# Patient Record
Sex: Female | Born: 1975 | Hispanic: No | Marital: Single | Smoking: Current every day smoker
Health system: Southern US, Community
[De-identification: ages and names within clinical notes are randomized; demographics above are authoritative.]

## PROBLEM LIST (undated history)

## (undated) DIAGNOSIS — F419 Anxiety disorder, unspecified: Secondary | ICD-10-CM

## (undated) DIAGNOSIS — K7469 Other cirrhosis of liver: Secondary | ICD-10-CM

## (undated) DIAGNOSIS — K760 Fatty (change of) liver, not elsewhere classified: Secondary | ICD-10-CM

## (undated) DIAGNOSIS — L409 Psoriasis, unspecified: Secondary | ICD-10-CM

## (undated) DIAGNOSIS — K7460 Unspecified cirrhosis of liver: Secondary | ICD-10-CM

## (undated) DIAGNOSIS — K3189 Other diseases of stomach and duodenum: Secondary | ICD-10-CM

## (undated) DIAGNOSIS — E119 Type 2 diabetes mellitus without complications: Secondary | ICD-10-CM

## (undated) DIAGNOSIS — R7989 Other specified abnormal findings of blood chemistry: Secondary | ICD-10-CM

## (undated) DIAGNOSIS — F411 Generalized anxiety disorder: Secondary | ICD-10-CM

## (undated) DIAGNOSIS — R945 Abnormal results of liver function studies: Secondary | ICD-10-CM

## (undated) DIAGNOSIS — E559 Vitamin D deficiency, unspecified: Secondary | ICD-10-CM

## (undated) DIAGNOSIS — K219 Gastro-esophageal reflux disease without esophagitis: Secondary | ICD-10-CM

## (undated) DIAGNOSIS — F329 Major depressive disorder, single episode, unspecified: Secondary | ICD-10-CM

## (undated) DIAGNOSIS — F32A Depression, unspecified: Secondary | ICD-10-CM

## (undated) HISTORY — DX: Unspecified cirrhosis of liver: K74.60

## (undated) HISTORY — DX: Fatty (change of) liver, not elsewhere classified: K76.0

## (undated) HISTORY — PX: LIVER BIOPSY: SHX301

## (undated) HISTORY — DX: Generalized anxiety disorder: F41.1

## (undated) HISTORY — DX: Vitamin D deficiency, unspecified: E55.9

## (undated) HISTORY — DX: Psoriasis, unspecified: L40.9

## (undated) HISTORY — DX: Gastro-esophageal reflux disease without esophagitis: K21.9

## (undated) HISTORY — DX: Type 2 diabetes mellitus without complications: E11.9

## (undated) HISTORY — DX: Depression, unspecified: F32.A

## (undated) HISTORY — DX: Anxiety disorder, unspecified: F41.9

## (undated) HISTORY — PX: UPPER GASTROINTESTINAL ENDOSCOPY: SHX188

## (undated) HISTORY — DX: Other diseases of stomach and duodenum: K31.89

## (undated) HISTORY — DX: Major depressive disorder, single episode, unspecified: F32.9

---

## 1997-03-20 HISTORY — PX: WISDOM TOOTH EXTRACTION: SHX21

## 2003-05-05 ENCOUNTER — Other Ambulatory Visit: Admission: RE | Admit: 2003-05-05 | Discharge: 2003-05-05 | Payer: Self-pay | Admitting: Obstetrics and Gynecology

## 2004-11-24 ENCOUNTER — Other Ambulatory Visit: Admission: RE | Admit: 2004-11-24 | Discharge: 2004-11-24 | Payer: Self-pay | Admitting: Obstetrics and Gynecology

## 2008-12-07 ENCOUNTER — Other Ambulatory Visit: Admission: RE | Admit: 2008-12-07 | Discharge: 2008-12-07 | Payer: Self-pay | Admitting: Family Medicine

## 2012-08-22 ENCOUNTER — Other Ambulatory Visit (HOSPITAL_COMMUNITY)
Admission: RE | Admit: 2012-08-22 | Discharge: 2012-08-22 | Disposition: A | Discharge status: Home / Self Care | Payer: BC Managed Care – PPO | Source: Ambulatory Visit | Attending: Obstetrics and Gynecology | Admitting: Obstetrics and Gynecology

## 2012-08-22 ENCOUNTER — Other Ambulatory Visit: Payer: Self-pay | Admitting: Nurse Practitioner

## 2012-08-22 DIAGNOSIS — Z1151 Encounter for screening for human papillomavirus (HPV): Secondary | ICD-10-CM | POA: Insufficient documentation

## 2012-08-22 DIAGNOSIS — Z01419 Encounter for gynecological examination (general) (routine) without abnormal findings: Secondary | ICD-10-CM | POA: Insufficient documentation

## 2013-08-12 ENCOUNTER — Other Ambulatory Visit: Payer: Self-pay | Admitting: Gastroenterology

## 2013-08-12 DIAGNOSIS — R1013 Epigastric pain: Secondary | ICD-10-CM

## 2013-08-18 ENCOUNTER — Ambulatory Visit
Admission: RE | Admit: 2013-08-18 | Discharge: 2013-08-18 | Disposition: A | Discharge status: Home / Self Care | Payer: BC Managed Care – PPO | Source: Ambulatory Visit | Attending: Gastroenterology | Admitting: Gastroenterology

## 2013-08-18 DIAGNOSIS — R1013 Epigastric pain: Secondary | ICD-10-CM

## 2013-08-20 ENCOUNTER — Other Ambulatory Visit: Payer: Self-pay | Admitting: Gastroenterology

## 2013-10-15 ENCOUNTER — Other Ambulatory Visit: Payer: Self-pay | Admitting: Gastroenterology

## 2013-10-15 DIAGNOSIS — R945 Abnormal results of liver function studies: Principal | ICD-10-CM

## 2013-10-15 DIAGNOSIS — R7989 Other specified abnormal findings of blood chemistry: Secondary | ICD-10-CM

## 2013-10-23 ENCOUNTER — Ambulatory Visit
Admission: RE | Admit: 2013-10-23 | Discharge: 2013-10-23 | Disposition: A | Discharge status: Home / Self Care | Payer: BC Managed Care – PPO | Source: Ambulatory Visit | Attending: Gastroenterology | Admitting: Gastroenterology

## 2013-10-23 DIAGNOSIS — R945 Abnormal results of liver function studies: Principal | ICD-10-CM

## 2013-10-23 DIAGNOSIS — R7989 Other specified abnormal findings of blood chemistry: Secondary | ICD-10-CM

## 2013-10-23 MED ORDER — IOHEXOL 300 MG/ML  SOLN
125.0000 mL | Freq: Once | INTRAMUSCULAR | Status: AC | PRN
  Administered 2013-10-23: 125 mL via INTRAVENOUS

## 2014-03-10 ENCOUNTER — Other Ambulatory Visit (HOSPITAL_COMMUNITY): Payer: Self-pay | Admitting: Gastroenterology

## 2014-03-10 DIAGNOSIS — R945 Abnormal results of liver function studies: Principal | ICD-10-CM

## 2014-03-10 DIAGNOSIS — R7989 Other specified abnormal findings of blood chemistry: Secondary | ICD-10-CM

## 2014-03-12 ENCOUNTER — Other Ambulatory Visit: Payer: Self-pay | Admitting: Radiology

## 2014-03-18 ENCOUNTER — Other Ambulatory Visit: Payer: Self-pay | Admitting: Radiology

## 2014-03-19 ENCOUNTER — Encounter (HOSPITAL_COMMUNITY): Payer: Self-pay

## 2014-03-19 ENCOUNTER — Ambulatory Visit (HOSPITAL_COMMUNITY)
Admission: RE | Admit: 2014-03-19 | Discharge: 2014-03-19 | Disposition: A | Discharge status: Home / Self Care | Payer: BC Managed Care – PPO | Source: Ambulatory Visit | Attending: Gastroenterology | Admitting: Gastroenterology

## 2014-03-19 DIAGNOSIS — R945 Abnormal results of liver function studies: Secondary | ICD-10-CM

## 2014-03-19 DIAGNOSIS — R740 Nonspecific elevation of levels of transaminase and lactic acid dehydrogenase [LDH]: Secondary | ICD-10-CM | POA: Insufficient documentation

## 2014-03-19 DIAGNOSIS — R7989 Other specified abnormal findings of blood chemistry: Secondary | ICD-10-CM

## 2014-03-19 LAB — CBC
HCT: 47.2 % — ABNORMAL HIGH (ref 36.0–46.0)
Hemoglobin: 16.2 g/dL — ABNORMAL HIGH (ref 12.0–15.0)
MCH: 30.3 pg (ref 26.0–34.0)
MCHC: 34.3 g/dL (ref 30.0–36.0)
MCV: 88.4 fL (ref 78.0–100.0)
Platelets: 180 10*3/uL (ref 150–400)
RBC: 5.34 MIL/uL — ABNORMAL HIGH (ref 3.87–5.11)
RDW: 12.3 % (ref 11.5–15.5)
WBC: 6.9 10*3/uL (ref 4.0–10.5)

## 2014-03-19 LAB — APTT: aPTT: 31 seconds (ref 24–37)

## 2014-03-19 LAB — PROTIME-INR
INR: 1.07 (ref 0.00–1.49)
Prothrombin Time: 14 seconds (ref 11.6–15.2)

## 2014-03-19 MED ORDER — MIDAZOLAM HCL 2 MG/2ML IJ SOLN
INTRAMUSCULAR | Status: AC | PRN
  Administered 2014-03-19 (×2): 1 mg via INTRAVENOUS

## 2014-03-19 MED ORDER — GELATIN ABSORBABLE 12-7 MM EX MISC
CUTANEOUS | Status: AC
  Filled 2014-03-19: qty 1

## 2014-03-19 MED ORDER — FENTANYL CITRATE 0.05 MG/ML IJ SOLN
INTRAMUSCULAR | Status: AC | PRN
  Administered 2014-03-19 (×2): 50 ug via INTRAVENOUS

## 2014-03-19 MED ORDER — SODIUM CHLORIDE 0.9 % IV SOLN
Freq: Once | INTRAVENOUS | Status: AC
  Administered 2014-03-19: 09:00:00 via INTRAVENOUS

## 2014-03-19 MED ORDER — MIDAZOLAM HCL 2 MG/2ML IJ SOLN
INTRAMUSCULAR | Status: AC
  Filled 2014-03-19: qty 4

## 2014-03-19 MED ORDER — FENTANYL CITRATE 0.05 MG/ML IJ SOLN
INTRAMUSCULAR | Status: AC
  Filled 2014-03-19: qty 4

## 2014-03-19 MED ORDER — LIDOCAINE HCL (PF) 1 % IJ SOLN
INTRAMUSCULAR | Status: AC
  Filled 2014-03-19: qty 10

## 2014-03-19 NOTE — Procedures (Signed)
Interventional Radiology Procedure Note  Procedure: US guided medical-liver biopsy. 3 x 18 G core bx.  Complications: No immediate Recommendations:  - Ok to shower tomorrow - Do not submerge for 7 days - follow up result - 3 hour observation  Signed,  Dulcy Fanny. Earleen Newport, DO

## 2014-03-19 NOTE — Sedation Documentation (Signed)
Patient denies pain and is resting comfortably.  

## 2014-03-19 NOTE — H&P (Signed)
Chief Complaint: "I'm here for a liver biopsy" Referring Physician:Schooler HPI: Carmen Nielsen is an 38 y.o. female being workup up for abd pain and abnormal liver enzymes. She had a CT scan suggesting possible cirrhosis. She is referred for random liver core biopsy. PMHx, meds reviewed. Has been NPO this am.   Past Medical History: History reviewed. No pertinent past medical history.  Past Surgical History: History reviewed. No pertinent past surgical history.  Family History: History reviewed. No pertinent family history.  Social History:  has no tobacco, alcohol, and drug history on file.  Allergies:  Allergies  Allergen Reactions  . Shellfish Allergy Nausea And Vomiting    Medications:   Medication List    ASK your doctor about these medications        DULoxetine 60 MG capsule  Commonly known as:  CYMBALTA  Take 120 mg by mouth daily.     NEXPLANON 68 MG Impl implant  Generic drug:  etonogestrel  1 each by Subdermal route once.        Please HPI for pertinent positives, otherwise complete 10 system ROS negative.  Physical Exam: BP 144/90 mmHg  Pulse 105  Temp(Src) 98.2 F (36.8 C) (Oral)  Resp 18  Ht 5' 8"  (1.727 m)  Wt 200 lb (90.719 kg)  BMI 30.42 kg/m2  SpO2 99% Body mass index is 30.42 kg/(m^2).   General Appearance:  Alert, cooperative, no distress, appears stated age  Head:  Normocephalic, without obvious abnormality, atraumatic  ENT: Unremarkable  Neck: Supple, symmetrical, trachea midline  Lungs:   Clear to auscultation bilaterally, no w/r/r, respirations unlabored without use of accessory muscles.  Chest Wall:  No tenderness or deformity  Heart:  Regular rate and rhythm, S1, S2 normal, no murmur, rub or gallop.  Abdomen:   Soft, non-tender, non distended.  Neurologic: Normal affect, no gross deficits.  Labs: No results found for this or any previous visit (from the past 48 hour(s)).  Imaging: No results found.  Assessment/Plan Elevated  LFTs, poss cirrhosis For US guided liver bx. Discussed procedure risks, complications, use of sedation with pt and mother Labs pending Consent signed in chart  Ascencion Dike PA-C 03/19/2014, 8:58 AM

## 2014-03-19 NOTE — Sedation Documentation (Signed)
Tried to call report to short stay, no room available per [facility] who stated she would call back in a few minutes.

## 2014-03-19 NOTE — Sedation Documentation (Signed)
First sample obtained. Pt tolerating well. VSS.

## 2014-03-19 NOTE — Discharge Instructions (Signed)
Liver Biopsy, Care After These instructions give you information on caring for yourself after your procedure. Your doctor may also give you more specific instructions. Call your doctor if you have any problems or questions after your procedure. HOME CARE  Rest at home for 1-2 days or as told by your doctor.  Have someone stay with you for at least 24 hours.  Do not do these things in the first 24 hours:  Drive.  Use machinery.  Take care of other people.  Sign legal documents.  Take a bath or shower.  There are many different ways to close and cover a cut (incision). For example, a cut can be closed with stitches, skin glue, or adhesive strips. Follow your doctor's instructions on:  Taking care of your cut.  Changing and removing your bandage (dressing).  Removing whatever was used to close your cut.  Do not drink alcohol in the first week.  Do not lift more than 5 pounds or play contact sports for the first 2 weeks.  Take medicines only as told by your doctor. For 1 week, do not take medicine that has aspirin in it or medicines like ibuprofen.  Get your test results. GET HELP IF:  A cut bleeds and leaves more than just a small spot of blood.  A cut is red, puffs up (swells), or hurts more than before.  Fluid or something else comes from a cut.  A cut smells bad.  You have a fever or chills. GET HELP RIGHT AWAY IF:  You have swelling, bloating, or pain in your belly (abdomen).  You get dizzy or faint.  You have a rash.  You feel sick to your stomach (nauseous) or throw up (vomit).  You have trouble breathing, feel short of breath, or feel faint.  Your chest hurts.  You have problems talking or seeing.  You have trouble balancing or moving your arms or legs. Document Released: 12/14/2007 Document Revised: 07/21/2013 Document Reviewed: 05/02/2013 [facility] Patient Information 2015 [place], [place]. This information is not intended to replace advice given to  you by your health care provider. Make sure you discuss any questions you have with your health care provider.

## 2015-08-12 DIAGNOSIS — K7460 Unspecified cirrhosis of liver: Secondary | ICD-10-CM | POA: Diagnosis not present

## 2015-08-12 DIAGNOSIS — K7581 Nonalcoholic steatohepatitis (NASH): Secondary | ICD-10-CM | POA: Diagnosis not present

## 2015-08-24 ENCOUNTER — Other Ambulatory Visit: Payer: Self-pay | Admitting: Nurse Practitioner

## 2015-08-24 ENCOUNTER — Other Ambulatory Visit (HOSPITAL_COMMUNITY)
Admission: RE | Admit: 2015-08-24 | Discharge: 2015-08-24 | Disposition: A | Discharge status: Home / Self Care | Payer: BLUE CROSS/BLUE SHIELD | Source: Ambulatory Visit | Attending: Nurse Practitioner | Admitting: Nurse Practitioner

## 2015-08-24 DIAGNOSIS — Z01419 Encounter for gynecological examination (general) (routine) without abnormal findings: Secondary | ICD-10-CM | POA: Diagnosis not present

## 2015-08-24 DIAGNOSIS — Z1151 Encounter for screening for human papillomavirus (HPV): Secondary | ICD-10-CM | POA: Diagnosis not present

## 2015-08-24 DIAGNOSIS — Z30433 Encounter for removal and reinsertion of intrauterine contraceptive device: Secondary | ICD-10-CM | POA: Diagnosis not present

## 2015-08-24 DIAGNOSIS — Z3202 Encounter for pregnancy test, result negative: Secondary | ICD-10-CM | POA: Diagnosis not present

## 2015-08-26 LAB — CYTOLOGY - PAP

## 2015-09-07 ENCOUNTER — Other Ambulatory Visit (HOSPITAL_COMMUNITY): Payer: Self-pay | Admitting: Gastroenterology

## 2015-09-07 DIAGNOSIS — R945 Abnormal results of liver function studies: Secondary | ICD-10-CM

## 2015-09-07 DIAGNOSIS — K7469 Other cirrhosis of liver: Secondary | ICD-10-CM

## 2015-09-07 DIAGNOSIS — R7989 Other specified abnormal findings of blood chemistry: Secondary | ICD-10-CM

## 2015-09-14 ENCOUNTER — Other Ambulatory Visit: Payer: Self-pay | Admitting: Radiology

## 2015-09-15 ENCOUNTER — Ambulatory Visit (HOSPITAL_COMMUNITY)
Admission: RE | Admit: 2015-09-15 | Discharge: 2015-09-15 | Disposition: A | Discharge status: Home / Self Care | Payer: BLUE CROSS/BLUE SHIELD | Source: Ambulatory Visit | Attending: Gastroenterology | Admitting: Gastroenterology

## 2015-09-15 ENCOUNTER — Encounter (HOSPITAL_COMMUNITY): Payer: Self-pay

## 2015-09-15 DIAGNOSIS — Z79899 Other long term (current) drug therapy: Secondary | ICD-10-CM | POA: Diagnosis not present

## 2015-09-15 DIAGNOSIS — R7989 Other specified abnormal findings of blood chemistry: Secondary | ICD-10-CM | POA: Diagnosis not present

## 2015-09-15 DIAGNOSIS — R945 Abnormal results of liver function studies: Secondary | ICD-10-CM

## 2015-09-15 DIAGNOSIS — K7460 Unspecified cirrhosis of liver: Secondary | ICD-10-CM | POA: Diagnosis not present

## 2015-09-15 DIAGNOSIS — K7469 Other cirrhosis of liver: Secondary | ICD-10-CM | POA: Diagnosis not present

## 2015-09-15 HISTORY — DX: Other specified abnormal findings of blood chemistry: R79.89

## 2015-09-15 HISTORY — DX: Other cirrhosis of liver: K74.69

## 2015-09-15 HISTORY — DX: Abnormal results of liver function studies: R94.5

## 2015-09-15 LAB — CBC
HCT: 46.3 % — ABNORMAL HIGH (ref 36.0–46.0)
Hemoglobin: 16.5 g/dL — ABNORMAL HIGH (ref 12.0–15.0)
MCH: 31 pg (ref 26.0–34.0)
MCHC: 35.6 g/dL (ref 30.0–36.0)
MCV: 87 fL (ref 78.0–100.0)
Platelets: 112 10*3/uL — ABNORMAL LOW (ref 150–400)
RBC: 5.32 MIL/uL — ABNORMAL HIGH (ref 3.87–5.11)
RDW: 11.8 % (ref 11.5–15.5)
WBC: 6.5 10*3/uL (ref 4.0–10.5)

## 2015-09-15 LAB — PROTIME-INR
INR: 1.14 (ref 0.00–1.49)
Prothrombin Time: 14.8 seconds (ref 11.6–15.2)

## 2015-09-15 LAB — APTT: aPTT: 32 seconds (ref 24–37)

## 2015-09-15 MED ORDER — FENTANYL CITRATE (PF) 100 MCG/2ML IJ SOLN
INTRAMUSCULAR | Status: AC | PRN
  Administered 2015-09-15 (×2): 50 ug via INTRAVENOUS

## 2015-09-15 MED ORDER — HYDROMORPHONE HCL 1 MG/ML IJ SOLN
1.0000 mg | Freq: Once | INTRAMUSCULAR | Status: AC
  Administered 2015-09-15: 1 mg via INTRAVENOUS

## 2015-09-15 MED ORDER — LIDOCAINE HCL 1 % IJ SOLN
INTRAMUSCULAR | Status: AC
  Filled 2015-09-15: qty 20

## 2015-09-15 MED ORDER — MIDAZOLAM HCL 2 MG/2ML IJ SOLN
INTRAMUSCULAR | Status: AC | PRN
  Administered 2015-09-15 (×2): 1 mg via INTRAVENOUS

## 2015-09-15 MED ORDER — HYDROMORPHONE HCL 1 MG/ML IJ SOLN
INTRAMUSCULAR | Status: AC
  Administered 2015-09-15: 1 mg via INTRAVENOUS
  Filled 2015-09-15: qty 1

## 2015-09-15 MED ORDER — GELATIN ABSORBABLE 12-7 MM EX MISC
CUTANEOUS | Status: AC
  Filled 2015-09-15: qty 1

## 2015-09-15 MED ORDER — FENTANYL CITRATE (PF) 100 MCG/2ML IJ SOLN
INTRAMUSCULAR | Status: AC
  Filled 2015-09-15: qty 2

## 2015-09-15 MED ORDER — SODIUM CHLORIDE 0.9 % IV SOLN
INTRAVENOUS | Status: AC | PRN
  Administered 2015-09-15: 10 mL/h via INTRAVENOUS

## 2015-09-15 MED ORDER — SODIUM CHLORIDE 0.9 % IV SOLN: Freq: Once | INTRAVENOUS | Status: DC

## 2015-09-15 MED ORDER — MIDAZOLAM HCL 2 MG/2ML IJ SOLN
INTRAMUSCULAR | Status: AC
  Filled 2015-09-15: qty 2

## 2015-09-15 NOTE — H&P (Signed)
Chief Complaint: Patient was seen in consultation today for liver core biopsy at the request of Carmen Nielsen  Referring Physician(s): Carmen Nielsen  Supervising Physician: Carmen Nielsen  Patient Status: Outpatient  History of Present Illness: Carmen Nielsen is a 40 y.o. female   Pt with cryptogenic cirrhosis Unknown etiology at this point Previous liver core bx 02/2014: Liver, biopsy, Right - END-STAGE LIVER DISEASE (CIRRHOSIS).  Persistent elevated LFTs Request for another liver core bx per Dr Carmen Nielsen   Past Medical History  Diagnosis Date  . Elevated LFTs   . Cryptogenic cirrhosis ([place])     History reviewed. No pertinent past surgical history.  Allergies: Shellfish allergy  Medications: Prior to Admission medications   Medication Sig Start Date End Date Taking? Authorizing Provider  DULoxetine (CYMBALTA) 60 MG capsule Take 120 mg by mouth daily.   Yes Historical Provider, MD  etonogestrel (NEXPLANON) 68 MG IMPL implant 1 each by Subdermal route once.   Yes Historical Provider, MD     History reviewed. No pertinent family history.  Social History   Social History  . Marital Status: Single    Spouse Name: N/A  . Number of Children: N/A  . Years of Education: N/A   Social History Main Topics  . Smoking status: Never Smoker   . Smokeless tobacco: None  . Alcohol Use: None  . Drug Use: None  . Sexual Activity: Not Asked   Other Topics Concern  . None   Social History Narrative      Review of Systems: A 12 point ROS discussed and pertinent positives are indicated in the HPI above.  All other systems are negative.  Review of Systems  Constitutional: Negative for fever and activity change.  Respiratory: Negative for cough and shortness of breath.   Gastrointestinal: Negative for abdominal pain.  Genitourinary: Negative for difficulty urinating.  Neurological: Negative for weakness.  Psychiatric/Behavioral: Negative for behavioral problems  and confusion.    Vital Signs: BP 135/93 mmHg  Pulse 93  Temp(Src) 98.3 F (36.8 C)  Resp 16  Ht 5' 8"  (1.727 m)  Wt 200 lb (90.719 kg)  BMI 30.42 kg/m2  SpO2 97%  Physical Exam  Constitutional: She is oriented to person, place, and time.  Cardiovascular: Normal rate and regular rhythm.   Pulmonary/Chest: Effort normal and breath sounds normal. She has no wheezes.  Abdominal: Soft. Bowel sounds are normal. There is no tenderness.  Musculoskeletal: Normal range of motion.  Neurological: She is alert and oriented to person, place, and time.  Skin: Skin is warm and dry.  Psychiatric: She has a normal mood and affect. Her behavior is normal. Judgment and thought content normal.  Nursing note and vitals reviewed.   Mallampati Score:  MD Evaluation Airway: WNL Heart: WNL Abdomen: WNL Chest/ Lungs: WNL ASA  Classification: 2 Mallampati/Airway Score: One  Imaging: No results found.  Labs:  CBC: No results for input(s): WBC, HGB, HCT, PLT in the last 8760 hours.  COAGS:  Recent Labs  09/15/15 1159  INR 1.14  APTT 32    BMP: No results for input(s): NA, K, CL, CO2, GLUCOSE, BUN, CALCIUM, CREATININE, GFRNONAA, GFRAA in the last 8760 hours.  Invalid input(s): CMP  LIVER FUNCTION TESTS: No results for input(s): BILITOT, AST, ALT, ALKPHOS, PROT, ALBUMIN in the last 8760 hours.  TUMOR MARKERS: No results for input(s): AFPTM, CEA, CA199, CHROMGRNA in the last 8760 hours.  Assessment and Plan:  Persistent elevated LFTs Cryptogenic Cirrhosis For random liver core biopsy Risks  and Benefits discussed with the patient including, but not limited to bleeding, infection, damage to adjacent structures or low yield requiring additional tests. All of the patient's questions were answered, patient is agreeable to proceed. Consent signed and in chart.   Thank you for this interesting consult.  I greatly enjoyed meeting Carmen Nielsen and look forward to participating in  their care.  A copy of this report was sent to the requesting provider on this date.  Electronically Signed: Monia Sabal A 09/15/2015, 1:02 PM   I spent a total of  30 Minutes   in face to face in clinical consultation, greater than 50% of which was counseling/coordinating care for liver core bx

## 2015-09-15 NOTE — Sedation Documentation (Signed)
Patient is resting comfortably. 

## 2015-09-15 NOTE — Sedation Documentation (Signed)
Patient denies pain and is resting comfortably.  

## 2015-09-15 NOTE — Procedures (Signed)
Interventional Radiology Procedure Note  Procedure: US guided liver biopsy  Complications: None  Estimated Blood Loss: None  Recommendations: - Bedrest x 3 hrs - DC home  Signed,  Criselda Peaches, MD

## 2015-09-15 NOTE — Discharge Instructions (Signed)

## 2015-09-28 DIAGNOSIS — K7460 Unspecified cirrhosis of liver: Secondary | ICD-10-CM | POA: Diagnosis not present

## 2015-11-10 DIAGNOSIS — F3341 Major depressive disorder, recurrent, in partial remission: Secondary | ICD-10-CM | POA: Diagnosis not present

## 2015-11-10 DIAGNOSIS — F422 Mixed obsessional thoughts and acts: Secondary | ICD-10-CM | POA: Diagnosis not present

## 2016-05-10 DIAGNOSIS — F3342 Major depressive disorder, recurrent, in full remission: Secondary | ICD-10-CM | POA: Diagnosis not present

## 2016-11-08 DIAGNOSIS — F3342 Major depressive disorder, recurrent, in full remission: Secondary | ICD-10-CM | POA: Diagnosis not present

## 2016-11-23 DIAGNOSIS — Z0000 Encounter for general adult medical examination without abnormal findings: Secondary | ICD-10-CM | POA: Diagnosis not present

## 2016-11-27 DIAGNOSIS — Z13220 Encounter for screening for lipoid disorders: Secondary | ICD-10-CM | POA: Diagnosis not present

## 2016-11-27 DIAGNOSIS — Z0000 Encounter for general adult medical examination without abnormal findings: Secondary | ICD-10-CM | POA: Diagnosis not present

## 2016-12-08 DIAGNOSIS — R739 Hyperglycemia, unspecified: Secondary | ICD-10-CM | POA: Diagnosis not present

## 2016-12-15 ENCOUNTER — Telehealth: Payer: Self-pay | Admitting: Gastroenterology

## 2016-12-15 NOTE — Telephone Encounter (Signed)
Received referral for patient to be seen for cirrhosis. Patient has been seen by Dr.Schooler and [facility] in the past. Patient states she was not happy with the care at [facility] and is not requesting a certain doctor here. Previous gi records received and placed on DOD for 12/14/16; Dr.Danis's desk for review.

## 2016-12-18 NOTE — Telephone Encounter (Signed)
I am not sure we have more to offer than an academic institution such as [facility], but she can come to [facility] for another opinion. Next new patient appointment with any MD (not APP).

## 2016-12-19 ENCOUNTER — Encounter: Payer: Self-pay | Admitting: Gastroenterology

## 2016-12-19 NOTE — Telephone Encounter (Signed)
Office Visit scheduled.

## 2017-02-14 ENCOUNTER — Ambulatory Visit: Payer: BLUE CROSS/BLUE SHIELD | Admitting: Gastroenterology

## 2017-02-14 ENCOUNTER — Encounter: Payer: Self-pay | Admitting: Gastroenterology

## 2017-02-14 VITALS — BP 110/80 | HR 88 | Ht 68.0 in | Wt 175.0 lb

## 2017-02-14 DIAGNOSIS — K7581 Nonalcoholic steatohepatitis (NASH): Secondary | ICD-10-CM

## 2017-02-14 DIAGNOSIS — E785 Hyperlipidemia, unspecified: Secondary | ICD-10-CM

## 2017-02-14 NOTE — Progress Notes (Signed)
[facility] Consult Note:  History: Carmen Nielsen 02/14/2017  Referring physician: Kathyrn Lass, MD  Reason for consult/chief complaint: Cirrhosis and NAFLD  Subjective  HPI:  This is a 41 year old woman referred by primary care noted above for another opinion regarding fatty liver and reported cirrhosis. She was initially seen by Dr. Wilford Nielsen @[facility] in 2015 for complaints of dyspepsia. Workup included upper endoscopy that was reportedly unremarkable, and imaging that suggested possible cirrhosis. She had a coarse echotexture of the liver with modest splenomegaly of 15 cm. Transaminases were elevated, sometimes above 200. She had an extensive workup for this that did not reveal a clear cause. It was apparently some initial concern about possible autoimmune hepatitis, but this turned out not to be true after an eventual consultation with Carmen Nielsen at the [facility] hepatology clinic and a liver biopsy in 2016. I reviewed office notes from [facility] hepatology. I do not have the actual liver biopsy report from 2016, but Dr. Hanley Nielsen note says "biopsy-proven cirrhosis". The patient has had known complications of cirrhosis. Her transaminases were variably elevated, with reportedly "no evidence of metabolic syndrome". No primary care notes from that time were available. She had a subsequent liver biopsy in 2017 because there was some lingering concern she could have finally declared with autoimmune hepatitis. The actual biopsy report is not available, but a letter from that physician indicates NAF LD/and ASA (grade 1/stage III). Therefore, it is somewhat confusing whether or not this patient had cirrhosis on the most recent biopsy.  Carmen Nielsen wanted another opinion because she just did not feel comfortable with the information she was receiving. She had been offered a clinical trial at the most recent [facility] hepatology visit in July 2017, but did not feel that she had  received enough information to do that. His appears to be the last hepatology evaluation she has had. Most recent primary care notes indicate elevated glucose to 243 and triglycerides of 325 with normal total cholesterol. She was apparently given advice on diet and lifestyle changes but no medicines are planned. She is feeling well this point, has no physical complaints. She has chronic depression under good control with Cymbalta. She has no family history of liver disease or early onset coronary artery disease. She has never had alcohol regularly, does not drink any alcohol now, tested negative for viral hepatitis, and is a current smoker. She does have a history of psoriasis but is never been on methotrexate.  ROS:  Review of Systems  Constitutional: Negative for appetite change and unexpected weight change.  HENT: Negative for mouth sores and voice change.   Eyes: Negative for pain and redness.  Respiratory: Negative for cough and shortness of breath.   Cardiovascular: Negative for chest pain and palpitations.  Genitourinary: Negative for dysuria and hematuria.  Musculoskeletal: Negative for arthralgias and myalgias.  Skin: Negative for pallor and rash.  Neurological: Negative for weakness and headaches.  Hematological: Negative for adenopathy.  Psychiatric/Behavioral: Positive for dysphoric mood.    She's had a purposeful 25 pound weight loss over the last few years. Past Medical History: Past Medical History:  Diagnosis Date  . Anxiety   . Cirrhosis ([place])   . Cryptogenic cirrhosis ([place])   . Depression   . DM (diabetes mellitus) ([place])   . Elevated LFTs   . GAD (generalized anxiety disorder)   . GERD (gastroesophageal reflux disease)   . Psoriasis   . Vitamin D deficiency  Past Surgical History: Past Surgical History:  Procedure Laterality Date  . LIVER BIOPSY     x 2     Family History: Family History  Problem Relation Age of Onset  . Bell's palsy Father   .  Colon polyps Father   . Esophageal cancer Maternal Grandfather   . Diabetes Maternal Grandfather   . Diabetes Paternal Grandmother   . Colon cancer Neg Hx   . Stomach cancer Neg Hx     Social History: Social History   Socioeconomic History  . Marital status: Single    Spouse name: None  . Number of children: 0  . Years of education: None  . Highest education level: None  Social Needs  . Financial resource strain: None  . Food insecurity - worry: None  . Food insecurity - inability: None  . Transportation needs - medical: None  . Transportation needs - non-medical: None  Occupational History  . Occupation: Medical sales representative  Tobacco Use  . Smoking status: Current Every Day Smoker  . Smokeless tobacco: Never Used  Substance and Sexual Activity  . Alcohol use: No    Frequency: Never  . Drug use: No  . Sexual activity: None  Other Topics Concern  . None  Social History Narrative  . None    Allergies: Allergies  Allergen Reactions  . Shellfish Allergy Nausea And Vomiting    Outpatient Meds: Current Outpatient Medications  Medication Sig Dispense Refill  . DULoxetine (CYMBALTA) 60 MG capsule Take 120 mg by mouth daily.    Marland Kitchen etonogestrel (NEXPLANON) 68 MG IMPL implant 1 each by Subdermal route once.     No current facility-administered medications for this visit.       ___________________________________________________________________ Objective   Exam:  BP 110/80 (BP Location: Right Arm, Patient Position: Sitting, Cuff Size: Normal)   Pulse 88   Ht 5' 8"  (1.727 m) Comment: height measured without shoes  Wt 175 lb (79.4 kg)   BMI 26.61 kg/m    General: this is a(n) well-appearing and pleasant woman   Eyes: sclera anicteric, no redness  ENT: oral mucosa moist without lesions, no cervical or supraclavicular lymphadenopathy, good dentition  CV: RRR without murmur, S1/S2, no JVD, no peripheral edema  Resp: clear to auscultation bilaterally, normal RR and  effort noted  GI: soft, no tenderness, with active bowel sounds. No guarding or palpable organomegaly noted.  Skin; warm and dry, no rash or jaundice noted, no spider nevi  Neuro: awake, alert and oriented x 3. Normal gross motor function and fluent speech, no asterixis, steady gait  Labs:  Hemoglobin A1c on 12/08/2016 is 11.3 on 12/06/2016 triglycerides 325 for cholesterol 190 HDL 25 LDL 100 AST 62 ALT 121 alkaline phosphatase 58 total bilirubin 0.6 albumin 4.6 creatinine 0.75, remainder of BMP normal except glucose 243   Last CBC available from August 2015 to be [place] 9.5 and 1116.4 platelet 176 INR 1.0 At that time, AST 101 ALT 150 alkaline phosphatase 72 albumin 4.8 total bilirubin 0.6 GGT 217 Assessment: Encounter Diagnoses  Name Primary?  Marland Kitchen NASH (nonalcoholic steatohepatitis) Yes  . Dyslipidemia     Her original diagnosis at the [facility] hepatology clinic was cryptogenic cirrhosis thought most likely to be from "burned-out Karlene Lineman or burned out autoimmune hepatitis". She really had minimal inflammation on most recent biopsy in 2017, and only stage III fibrosis as well. It is not clear if that represents some sampling error or a regression of more severe fibrosis discovered on the prior biopsy.  I will lead to contact Dr. Monica Nielsen to hopefully clarify this somewhat. I think it is important, because if she truly has sufficient fibrosis to meet criteria of cirrhosis, then she should be under regular hepatoma surveillance and perhaps variceal screening as well. She is in need of more recent CBC and INR, but she might need some further blood work related to her dyslipidemia through primary care.  Even though the glucose of 243 may not been fasting, her hemoglobin A1c clearly indicates she is diabetic. It is not clear that she either understands that it is been attended to by primary care, or both.  I am not sure that the triglycerides are in the range that would normally require medicines, but  I will try to contact primary care and discuss that with them. Vision, would like to find out the plan for managing her diabetes.  Yomayra was hoping to just have a new "home" for hepatology care here in [place], and I'm happy to provide that. No other medicines to prescribe her test to perform at this point until I get some more information from the [facility] hepatology clinic and her primary care provider. I will plan to see her back in 3 months, or sooner if more information comes to light requiring closer follow up on that.  Total time 60 minutes including face-to-face encounter, counseling and coordinate and care and extensive record review.  Thank you for the courtesy of this consult.  Please call me with any questions or concerns.  Nelida Meuse III  CC: Carmen Lass, MD

## 2017-02-14 NOTE — Patient Instructions (Signed)
If you are age 40 or older, your body mass index should be between 23-30. Your Body mass index is 26.61 kg/m. If this is out of the aforementioned range listed, please consider follow up with your Primary Care Provider.  If you are age 72 or younger, your body mass index should be between 19-25. Your Body mass index is 26.61 kg/m. If this is out of the aformentioned range listed, please consider follow up with your Primary Care Provider.   Please follow up in 3 months. We will contact you closer to this time to schedule.   Thank you for choosing [facility]  Dr Wilfrid Lund III

## 2017-03-01 ENCOUNTER — Telehealth: Payer: Self-pay | Admitting: Gastroenterology

## 2017-03-01 NOTE — Telephone Encounter (Signed)
Left message for patient to call back, I have some information and Dr. Loletha Carrow' recommendations to relay to her.

## 2017-03-01 NOTE — Telephone Encounter (Signed)
Carmen Nielsen,  Please let this patient know that I did some follow-up on her condition.  I emailed her hepatologist at [facility], the reply that I received did not give me any more information than I had in the written records. I also left a message for her primary care provider asking him to follow-up with her regarding her new  diagnosis of diabetes.  Review of her blood work from primary care indicates diabetes, and it seems as if they wanted her to start some medication.  Perhaps there was some miscommunication, because Carmen Nielsen did not seem to know that that was the plan.  I did not hear back from her primary care provider, so I strongly encouraged Carmen Nielsen to contact them and be seen in the near future to discuss medication for her diabetes.  Regarding the liver condition, she and I discussed how there was apparent improvement on the amount of scar tissue seen between the 2 biopsies, most recently in 2017.  On that most recent biopsy, the amount of scar tissue was really on the borderline amount to qualify for cirrhosis.  The reason it is especially important is because of the patient has cirrhosis, they must have regular screening for liver cancer because they are at an increased risk.  I think the best way for [place] to understand this better is for her to have a special kind of liver scan called elastography.  This gives [place] a  different way to measure scar tissue that may be present throughout the liver.  It can be helpful in this kind of situation.  If she is agreeable, please make arrangements for her to have a Fibroscan (elast of the liverography) at her convenience.  I would like to see her back in clinic follow-up afterwards to discuss the findings and make a plan for how to follow her best in the future.

## 2017-03-02 NOTE — Telephone Encounter (Signed)
Left another message for patient to call back 

## 2017-03-06 ENCOUNTER — Other Ambulatory Visit: Payer: Self-pay

## 2017-03-06 DIAGNOSIS — K7469 Other cirrhosis of liver: Secondary | ICD-10-CM

## 2017-03-06 DIAGNOSIS — R945 Abnormal results of liver function studies: Secondary | ICD-10-CM

## 2017-03-06 DIAGNOSIS — R7989 Other specified abnormal findings of blood chemistry: Secondary | ICD-10-CM

## 2017-03-06 DIAGNOSIS — R7309 Other abnormal glucose: Secondary | ICD-10-CM | POA: Diagnosis not present

## 2017-03-06 NOTE — Telephone Encounter (Signed)
Spoke to patient, advised of Dr. Loletha Carrow' recommendations. She is scheduled for [place] elastography at [facility] on 03/23/17 arrive 8:45 for 9:00 am, NPO after midnight. Patient has follow up appointment here on 04/13/17.

## 2017-03-23 ENCOUNTER — Ambulatory Visit (HOSPITAL_COMMUNITY)
Admission: RE | Admit: 2017-03-23 | Discharge: 2017-03-23 | Disposition: A | Discharge status: Home / Self Care | Payer: BLUE CROSS/BLUE SHIELD | Source: Ambulatory Visit | Attending: Gastroenterology | Admitting: Gastroenterology

## 2017-03-23 DIAGNOSIS — K7469 Other cirrhosis of liver: Secondary | ICD-10-CM

## 2017-03-23 DIAGNOSIS — R7989 Other specified abnormal findings of blood chemistry: Secondary | ICD-10-CM

## 2017-03-23 DIAGNOSIS — K7460 Unspecified cirrhosis of liver: Secondary | ICD-10-CM | POA: Diagnosis not present

## 2017-03-23 DIAGNOSIS — R945 Abnormal results of liver function studies: Secondary | ICD-10-CM | POA: Insufficient documentation

## 2017-04-13 ENCOUNTER — Other Ambulatory Visit (INDEPENDENT_AMBULATORY_CARE_PROVIDER_SITE_OTHER): Payer: BLUE CROSS/BLUE SHIELD

## 2017-04-13 ENCOUNTER — Encounter: Payer: Self-pay | Admitting: Gastroenterology

## 2017-04-13 ENCOUNTER — Ambulatory Visit: Payer: BLUE CROSS/BLUE SHIELD | Admitting: Gastroenterology

## 2017-04-13 VITALS — BP 114/62 | HR 70 | Ht 68.0 in | Wt 169.0 lb

## 2017-04-13 DIAGNOSIS — K7581 Nonalcoholic steatohepatitis (NASH): Secondary | ICD-10-CM

## 2017-04-13 DIAGNOSIS — K7460 Unspecified cirrhosis of liver: Secondary | ICD-10-CM

## 2017-04-13 LAB — CBC WITH DIFFERENTIAL/PLATELET
Basophils Absolute: 0 10*3/uL (ref 0.0–0.1)
Basophils Relative: 0.6 % (ref 0.0–3.0)
Eosinophils Absolute: 0.2 10*3/uL (ref 0.0–0.7)
Eosinophils Relative: 3 % (ref 0.0–5.0)
HCT: 44.5 % (ref 36.0–46.0)
Hemoglobin: 14.7 g/dL (ref 12.0–15.0)
Lymphocytes Relative: 34.5 % (ref 12.0–46.0)
Lymphs Abs: 1.7 10*3/uL (ref 0.7–4.0)
MCHC: 33.1 g/dL (ref 30.0–36.0)
MCV: 81 fl (ref 78.0–100.0)
Monocytes Absolute: 0.2 10*3/uL (ref 0.1–1.0)
Monocytes Relative: 4.3 % (ref 3.0–12.0)
Neutro Abs: 2.9 10*3/uL (ref 1.4–7.7)
Neutrophils Relative %: 57.6 % (ref 43.0–77.0)
Platelets: 147 10*3/uL — ABNORMAL LOW (ref 150.0–400.0)
RBC: 5.5 Mil/uL — ABNORMAL HIGH (ref 3.87–5.11)
RDW: 14.9 % (ref 11.5–15.5)
WBC: 5 10*3/uL (ref 4.0–10.5)

## 2017-04-13 LAB — PROTIME-INR
INR: 1.2 ratio — ABNORMAL HIGH (ref 0.8–1.0)
Prothrombin Time: 12.6 s (ref 9.6–13.1)

## 2017-04-13 NOTE — Patient Instructions (Signed)
If you are age 41 or older, your body mass index should be between 23-30. Your Body mass index is 25.7 kg/m. If this is out of the aforementioned range listed, please consider follow up with your Primary Care Provider.  If you are age 76 or younger, your body mass index should be between 19-25. Your Body mass index is 25.7 kg/m. If this is out of the aformentioned range listed, please consider follow up with your Primary Care Provider.   You have been scheduled for an endoscopy. Please follow written instructions given to you at your visit today. If you use inhalers (even only as needed), please bring them with you on the day of your procedure. Your physician has requested that you go to www.startemmi.com and enter the access code given to you at your visit today. This web site gives a general overview about your procedure. However, you should still follow specific instructions given to you by our office regarding your preparation for the procedure.  Thank you for choosing [facility]  Dr Wilfrid Lund III

## 2017-04-13 NOTE — Progress Notes (Signed)
[facility] Progress Note  Chief Complaint: Cirrhosis  Subjective  History:  This is follow-up for a 42 year old woman who came to see me for another opinion after previous care with [facility] pathology.  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  It was felt to have likely been either [place] or "burned out" autoimmune hepatitis.  The degree of fibrosis was less than a second biopsy in the first, so was unclear if she truly met criteria for cirrhosis.  Therefore, after reviewing records and messaging her previous hepatologist, I sent her for elastography with findings as noted below.  Carmen Nielsen is feeling well, and reports that she did not start any medicines for diabetes.  She decreased her carbohydrate intake, and says a repeat hemoglobin A1c was 6.8.  She was not advised to start any medicine by primary care at that point. She continues to feel well, and has no digestive symptoms. ROS: Cardiovascular:  no chest pain Respiratory: no dyspnea  The patient's Past Medical, Family and Social History were reviewed and are on file in the EMR.  Objective:  Med list reviewed  Current Outpatient Medications:  .  DULoxetine (CYMBALTA) 60 MG capsule, Take 120 mg by mouth daily., Disp: , Rfl:  .  etonogestrel (NEXPLANON) 68 MG IMPL implant, 1 each by Subdermal route once., Disp: , Rfl:    Vital signs in last 24 hrs: Vitals:   04/13/17 1330  BP: 114/62  Pulse: 70    Physical Exam   No exam, 20-minute visit, all of which spent in discussion of her findings and cirrhosis and plan as described below.  Recent Labs:    Radiologic studies: COMPARISON:  05/19/2015   FINDINGS: ULTRASOUND ABDOMEN   Gallbladder: No gallstones or wall thickening visualized. No sonographic Murphy sign noted by sonographer.   Common bile duct: Diameter: Normal caliber, 3 mm   Liver: Increased echotexture compatible with fatty infiltration. No focal abnormality or  biliary ductal dilatation. Portal vein is patent on color Doppler imaging with normal direction of blood flow towards the liver.   IVC: No abnormality visualized.   Pancreas: Visualized portion unremarkable.   Spleen: Enlarged with a craniocaudal length of 14.2 cm and a volume of 684 mL.   Right Kidney: Length: 11.7 cm. Echogenicity within normal limits. No mass or hydronephrosis visualized.   Left Kidney: Length: 10.9 cm. Echogenicity within normal limits. No mass or hydronephrosis visualized.   Abdominal aorta: No aneurysm visualized.   Other findings: None.   ULTRASOUND HEPATIC ELASTOGRAPHY   Device: Siemens Helix VTQ   Patient position: Left lateral decubitus   Transducer 6C1   Number of measurements: 10   Hepatic segment:  8   Median velocity:   2.71  m/sec   IQR: 0.52   IQR/Median velocity ratio: 0.19   Corresponding Metavir fibrosis score:  Some F3 + F4   Risk of fibrosis: High   Limitations of exam: None   Pertinent findings noted on other imaging exams:  None   Please note that abnormal shear wave velocities may also be identified in clinical settings other than with hepatic fibrosis, such as: acute hepatitis, elevated right heart and central venous pressures including use of beta blockers, veno-occlusive disease (Budd[place]Chiari), infiltrative processes such as mastocytosis/amyloidosis/infiltrative tumor, extrahepatic cholestasis, in the post-prandial state, and liver transplantation. Correlation with patient history, laboratory data, and clinical condition recommended.   IMPRESSION: ULTRASOUND ABDOMEN: Increased echotexture throughout the liver compatible with fatty infiltration or  intrinsic liver disease.   ULTRASOUND HEPATIC ELASTOGRAPHY:   Median hepatic shear wave velocity is calculated at 2.71 m/sec.   Corresponding Metavir fibrosis score is  Some F3 + F4.   Risk of fibrosis is High.   _0 @ Assessment: Encounter  Diagnoses  Name Primary?  Marland Kitchen NASH (nonalcoholic steatohepatitis) Yes  . Cirrhosis of liver without ascites, unspecified hepatic cirrhosis type ([place])    Early stage compensated cirrhosis from NASH (minimal inflammation seen on biopsy at [facility]).  We discussed the need for variceal screening and regular hepatoma screening.   Plan:  CBC, INR, AFP EGD.  She is agreeable after thorough discussion of procedure and risks.  The benefits and risks of the planned procedure were described in detail with the patient or (when appropriate) their health care proxy.  Risks were outlined as including, but not limited to, bleeding, infection, perforation, adverse medication reaction leading to cardiac or pulmonary decompensation, or pancreatitis (if ERCP).  The limitation of incomplete mucosal visualization was also discussed.  No guarantees or warranties were given.   Nelida Meuse III

## 2017-04-16 LAB — AFP TUMOR MARKER: AFP-Tumor Marker: 2.2 ng/mL

## 2017-04-26 ENCOUNTER — Other Ambulatory Visit: Payer: Self-pay

## 2017-04-26 ENCOUNTER — Ambulatory Visit (AMBULATORY_SURGERY_CENTER): Payer: BLUE CROSS/BLUE SHIELD | Admitting: Gastroenterology

## 2017-04-26 ENCOUNTER — Encounter: Payer: Self-pay | Admitting: Gastroenterology

## 2017-04-26 VITALS — BP 112/72 | HR 88 | Temp 98.4°F | Resp 16 | Ht 68.0 in | Wt 169.0 lb

## 2017-04-26 DIAGNOSIS — K7581 Nonalcoholic steatohepatitis (NASH): Secondary | ICD-10-CM | POA: Diagnosis present

## 2017-04-26 DIAGNOSIS — K7460 Unspecified cirrhosis of liver: Secondary | ICD-10-CM

## 2017-04-26 MED ORDER — SODIUM CHLORIDE 0.9 % IV SOLN: 500.0000 mL | Freq: Once | INTRAVENOUS | Status: DC

## 2017-04-26 MED ORDER — NADOLOL 20 MG PO TABS: 20.0000 mg | ORAL_TABLET | Freq: Every day | ORAL | 11 refills | Status: DC

## 2017-04-26 NOTE — Patient Instructions (Signed)
YOU HAD AN ENDOSCOPIC PROCEDURE TODAY AT [facility]:   Refer to the procedure report that was given to you for any specific questions about what was found during the examination.  If the procedure report does not answer your questions, please call your gastroenterologist to clarify.  If you requested that your care partner not be given the details of your procedure findings, then the procedure report has been included in a sealed envelope for you to review at your convenience later.  YOU SHOULD EXPECT: Some feelings of bloating in the abdomen. Passage of more gas than usual.  Walking can help get rid of the air that was put into your GI tract during the procedure and reduce the bloating.   Please Note:  You might notice some irritation and congestion in your nose or some drainage.  This is from the oxygen used during your procedure.  There is no need for concern and it should clear up in a day or so.  SYMPTOMS TO REPORT IMMEDIATELY:    Following upper endoscopy (EGD)  Vomiting of blood or coffee ground material  New chest pain or pain under the shoulder blades  Painful or persistently difficult swallowing  New shortness of breath  Fever of 100F or higher  Black, tarry-looking stools  For urgent or emergent issues, a gastroenterologist can be reached at any hour by calling [phone].   DIET:  We do recommend a small meal at first, but then you may proceed to your regular diet.  Drink plenty of fluids but you should avoid alcoholic beverages for 24 hours.  ACTIVITY:  You should plan to take it easy for the rest of today and you should NOT DRIVE or use heavy machinery until tomorrow (because of the sedation medicines used during the test).    FOLLOW UP: Our staff will call the number listed on your records the next business day following your procedure to check on you and address any questions or concerns that you may have regarding the information given to you  following your procedure. If we do not reach you, we will leave a message.  However, if you are feeling well and you are not experiencing any problems, there is no need to return our call.  We will assume that you have returned to your regular daily activities without incident.  I called in your new medication called Nadolol (coreg) to [facility] on [address]. Take as directed.  If any biopsies were taken you will be contacted by phone or by letter within the next 1-3 weeks.  Please call us at [phone] if you have not heard about the biopsies in 3 weeks.    SIGNATURES/CONFIDENTIALITY: You and/or your care partner have signed paperwork which will be entered into your electronic medical record.  These signatures attest to the fact that that the information above on your After Visit Summary has been reviewed and is understood.  Full responsibility of the confidentiality of this discharge information lies with you and/or your care-partner.

## 2017-04-26 NOTE — Op Note (Signed)
[facility] Patient Name: Carmen Nielsen Procedure Date: 04/26/2017 9:42 AM MRN: [id number] Endoscopist: Mallie Mussel L. Loletha Carrow , MD Age: 42 Referring MD:  Date of Birth: 1975/05/08 Gender: Female Account #: [id number] Procedure:                Upper GI endoscopy Indications:              Cirrhosis (NASH), rule out esophageal varices Medicines:                Monitored Anesthesia Care Procedure:                Pre-Anesthesia Assessment:                           - Prior to the procedure, a History and Physical                            was performed, and patient medications and                            allergies were reviewed. The patient's tolerance of                            previous anesthesia was also reviewed. The risks                            and benefits of the procedure and the sedation                            options and risks were discussed with the patient.                            All questions were answered, and informed consent                            was obtained. Prior Anticoagulants: The patient has                            taken no previous anticoagulant or antiplatelet                            agents. ASA Grade Assessment: III - A patient with                            severe systemic disease. After reviewing the risks                            and benefits, the patient was deemed in                            satisfactory condition to undergo the procedure.                           After obtaining informed consent, the endoscope was  passed under direct vision. Throughout the                            procedure, the patient's blood pressure, pulse, and                            oxygen saturations were monitored continuously. The                            Endoscope was introduced through the mouth, and                            advanced to the second part of duodenum. The upper                            GI  endoscopy was accomplished without difficulty.                            The patient tolerated the procedure well. Scope In: Scope Out: Findings:                 The larynx was normal.                           The esophagus was normal.                           There is no endoscopic evidence of varices in the                            entire esophagus.                           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                           Mild portal hypertensive gastropathy was found in                            the entire examined stomach.                           The examined duodenum was normal. Complications:            No immediate complications. Estimated Blood Loss:     Estimated blood loss: none. Impression:               - Normal larynx.                           - Normal esophagus.                           -  Type 1 isolated gastric varices (IGV1, varices                            located in the fundus).                           - Portal hypertensive gastropathy.                           - Normal examined duodenum.                           - No specimens collected. Recommendation:           - Patient has a contact number available for                            emergencies. The signs and symptoms of potential                            delayed complications were discussed with the                            patient. Return to normal activities tomorrow.                            Written discharge instructions were provided to the                            patient.                           - Resume previous diet.                           - Continue present medications.                           - Begin nadolol 20 mg once daily for primary                            prophylaxis of variceal bleeding. Henry L. Loletha Carrow,  MD 04/26/2017 10:01:55 AM This report has been signed electronically.

## 2017-04-26 NOTE — Progress Notes (Signed)
Report given to PACU, vss 

## 2017-04-27 ENCOUNTER — Telehealth: Payer: Self-pay

## 2017-04-27 NOTE — Telephone Encounter (Signed)
  Follow up Call-  Call back number 04/26/2017  Post procedure Call Back phone  # [phone]  Permission to leave phone message Yes  Some recent data might be hidden     Patient questions:  Do you have a fever, pain , or abdominal swelling? No. Pain Score  0 *  Have you tolerated food without any problems? Yes.    Have you been able to return to your normal activities? Yes.    Do you have any questions about your discharge instructions: Diet   No. Medications  No. Follow up visit  No.  Do you have questions or concerns about your Care? No.  Actions: * If pain score is 4 or above: No action needed, pain <4.

## 2017-05-10 DIAGNOSIS — F3342 Major depressive disorder, recurrent, in full remission: Secondary | ICD-10-CM | POA: Diagnosis not present

## 2017-05-16 DIAGNOSIS — H5213 Myopia, bilateral: Secondary | ICD-10-CM | POA: Diagnosis not present

## 2017-05-16 DIAGNOSIS — E119 Type 2 diabetes mellitus without complications: Secondary | ICD-10-CM | POA: Diagnosis not present

## 2017-05-16 DIAGNOSIS — H52223 Regular astigmatism, bilateral: Secondary | ICD-10-CM | POA: Diagnosis not present

## 2017-06-12 ENCOUNTER — Other Ambulatory Visit: Payer: Self-pay | Admitting: Nurse Practitioner

## 2017-06-12 DIAGNOSIS — N9089 Other specified noninflammatory disorders of vulva and perineum: Secondary | ICD-10-CM | POA: Diagnosis not present

## 2017-06-12 DIAGNOSIS — L821 Other seborrheic keratosis: Secondary | ICD-10-CM | POA: Diagnosis not present

## 2017-06-15 DIAGNOSIS — J101 Influenza due to other identified influenza virus with other respiratory manifestations: Secondary | ICD-10-CM | POA: Diagnosis not present

## 2017-07-05 DIAGNOSIS — N7689 Other specified inflammation of vagina and vulva: Secondary | ICD-10-CM | POA: Diagnosis not present

## 2017-09-16 DIAGNOSIS — L03031 Cellulitis of right toe: Secondary | ICD-10-CM | POA: Diagnosis not present

## 2017-09-16 DIAGNOSIS — L600 Ingrowing nail: Secondary | ICD-10-CM | POA: Diagnosis not present

## 2017-11-07 DIAGNOSIS — L570 Actinic keratosis: Secondary | ICD-10-CM | POA: Diagnosis not present

## 2017-11-08 DIAGNOSIS — F3341 Major depressive disorder, recurrent, in partial remission: Secondary | ICD-10-CM | POA: Diagnosis not present

## 2017-12-04 DIAGNOSIS — K7460 Unspecified cirrhosis of liver: Secondary | ICD-10-CM | POA: Diagnosis not present

## 2017-12-04 DIAGNOSIS — E1165 Type 2 diabetes mellitus with hyperglycemia: Secondary | ICD-10-CM | POA: Diagnosis not present

## 2017-12-04 DIAGNOSIS — Z0000 Encounter for general adult medical examination without abnormal findings: Secondary | ICD-10-CM | POA: Diagnosis not present

## 2018-02-08 DIAGNOSIS — D2239 Melanocytic nevi of other parts of face: Secondary | ICD-10-CM | POA: Diagnosis not present

## 2018-02-08 DIAGNOSIS — L570 Actinic keratosis: Secondary | ICD-10-CM | POA: Diagnosis not present

## 2018-04-12 ENCOUNTER — Ambulatory Visit: Payer: BLUE CROSS/BLUE SHIELD | Admitting: Gastroenterology

## 2018-04-16 ENCOUNTER — Encounter: Payer: Self-pay | Admitting: Gastroenterology

## 2018-04-16 ENCOUNTER — Other Ambulatory Visit (INDEPENDENT_AMBULATORY_CARE_PROVIDER_SITE_OTHER): Payer: BLUE CROSS/BLUE SHIELD

## 2018-04-16 ENCOUNTER — Ambulatory Visit: Payer: BLUE CROSS/BLUE SHIELD | Admitting: Gastroenterology

## 2018-04-16 VITALS — BP 104/62 | HR 80 | Ht 68.0 in | Wt 189.6 lb

## 2018-04-16 DIAGNOSIS — K7460 Unspecified cirrhosis of liver: Secondary | ICD-10-CM | POA: Diagnosis not present

## 2018-04-16 DIAGNOSIS — K7581 Nonalcoholic steatohepatitis (NASH): Secondary | ICD-10-CM

## 2018-04-16 LAB — COMPREHENSIVE METABOLIC PANEL
ALT: 25 U/L (ref 0–35)
AST: 22 U/L (ref 0–37)
Albumin: 4.4 g/dL (ref 3.5–5.2)
Alkaline Phosphatase: 48 U/L (ref 39–117)
BUN: 9 mg/dL (ref 6–23)
CO2: 26 mEq/L (ref 19–32)
Calcium: 10.1 mg/dL (ref 8.4–10.5)
Chloride: 103 mEq/L (ref 96–112)
Creatinine, Ser: 0.77 mg/dL (ref 0.40–1.20)
GFR: 82.52 mL/min (ref >60.00)
Glucose, Bld: 142 mg/dL — ABNORMAL HIGH (ref 70–99)
Potassium: 3.9 mEq/L (ref 3.5–5.1)
Sodium: 139 mEq/L (ref 135–145)
Total Bilirubin: 0.5 mg/dL (ref 0.2–1.2)
Total Protein: 7.2 g/dL (ref 6.0–8.3)

## 2018-04-16 LAB — CBC WITH DIFFERENTIAL/PLATELET
Basophils Absolute: 0 10*3/uL (ref 0.0–0.1)
Basophils Relative: 0.7 % (ref 0.0–3.0)
Eosinophils Absolute: 0.3 10*3/uL (ref 0.0–0.7)
Eosinophils Relative: 3.7 % (ref 0.0–5.0)
HCT: 49.2 % — ABNORMAL HIGH (ref 36.0–46.0)
Hemoglobin: 16.8 g/dL — ABNORMAL HIGH (ref 12.0–15.0)
Lymphocytes Relative: 37.7 % (ref 12.0–46.0)
Lymphs Abs: 2.7 10*3/uL (ref 0.7–4.0)
MCHC: 34.2 g/dL (ref 30.0–36.0)
MCV: 90.3 fl (ref 78.0–100.0)
Monocytes Absolute: 0.4 10*3/uL (ref 0.1–1.0)
Monocytes Relative: 5.1 % (ref 3.0–12.0)
Neutro Abs: 3.8 10*3/uL (ref 1.4–7.7)
Neutrophils Relative %: 52.8 % (ref 43.0–77.0)
Platelets: 223 10*3/uL (ref 150.0–400.0)
RBC: 5.45 Mil/uL — ABNORMAL HIGH (ref 3.87–5.11)
RDW: 12.6 % (ref 11.5–15.5)
WBC: 7.1 10*3/uL (ref 4.0–10.5)

## 2018-04-16 LAB — PROTIME-INR
INR: 1.1 ratio — ABNORMAL HIGH (ref 0.8–1.0)
Prothrombin Time: 12.3 s (ref 9.6–13.1)

## 2018-04-16 MED ORDER — NADOLOL 20 MG PO TABS: 20.0000 mg | ORAL_TABLET | Freq: Every day | ORAL | 6 refills | Status: DC

## 2018-04-16 NOTE — Patient Instructions (Signed)
If you are age 42 or older, your body mass index should be between 23-30. Your Body mass index is 28.83 kg/m. If this is out of the aforementioned range listed, please consider follow up with your Primary Care Provider.  If you are age 59 or younger, your body mass index should be between 19-25. Your Body mass index is 28.83 kg/m. If this is out of the aformentioned range listed, please consider follow up with your Primary Care Provider.   You have been scheduled for an abdominal ultrasound at [facility] (1st floor of hospital) on 04-22-2018 at 830am. Please arrive 15 minutes prior to your appointment for registration. Make certain not to have anything to eat or drink 6 hours prior to your appointment. Should you need to reschedule your appointment, please contact radiology at [phone]. This test typically takes about 30 minutes to perform.  Your provider has requested that you go to the basement level for lab work before leaving today. Press "B" on the elevator. The lab is located at the first door on the left as you exit the elevator.  It was a pleasure to see you today!  Dr. Loletha Carrow

## 2018-04-16 NOTE — Progress Notes (Addendum)
     [facility] Progress Note  Chief Complaint: Cirrhosis  Subjective  History:  Carmen Nielsen last saw me in early 2019 for cirrhosis from "burned out" NASH.  Elastography showed stage III/IV fibrosis, previous biopsies at another institution had been somewhat discordant.  Upper endoscopy February 2019 showed large isolated gastric varices, and nadolol was prescribed.  This is her first visit since then.  Carmen Nielsen is feeling well, with no dysphagia, odynophagia, nausea vomiting, weight loss hematemesis or rectal bleeding.  For years she has had occasional epigastric burning with no clear triggers, and it passes briefly. She has been taking her medications regularly. ROS: Cardiovascular:  no chest pain Respiratory: no dyspnea Mood stable  The patient's Past Medical, Family and Social History were reviewed and are on file in the EMR. Works at [employer] in [employer] Denies alcohol use Objective:  Med list reviewed  Current Outpatient Medications:  .  DULoxetine (CYMBALTA) 60 MG capsule, Take 120 mg by mouth daily., Disp: , Rfl:  .  etonogestrel (NEXPLANON) 68 MG IMPL implant, 1 each by Subdermal route once., Disp: , Rfl:  .  nadolol (CORGARD) 20 MG tablet, Take 1 tablet (20 mg total) by mouth daily., Disp: 30 tablet, Rfl: 6   Vital signs in last 24 hrs: Vitals:   04/16/18 0832  BP: 104/62  Pulse: 80    Physical Exam  Well-appearing  HEENT: sclera anicteric, oral mucosa moist without lesions  Neck: supple, no thyromegaly, JVD or lymphadenopathy  Cardiac: RRR without murmurs, S1S2 heard, no peripheral edema  Pulm: clear to auscultation bilaterally, normal RR and effort noted  Abdomen: soft, no tenderness, with active bowel sounds.  Left lobe liver enlarged, no spleen tip palpable.  No bulging flanks.  Skin; warm and dry, no jaundice or rash Neuro: Steady gait, fluent speech, no asterixis, normal coordination and gross motor function  No data to review  since last visit   @ASSESSMENTPLANBEGIN @ Assessment: Encounter Diagnoses  Name Primary?  Marland Kitchen NASH (nonalcoholic steatohepatitis) Yes  . Cirrhosis of liver without ascites, unspecified hepatic cirrhosis type ([place])   Gastric varices  Compensated cirrhosis with isolated gastric varices on primary prophylaxis on nadolol.  Plan: Update CBC, CMP, INR AFP and right upper quadrant ultrasound for hepatoma screening Continue nadolol at current dose.  Blood pressure will not allow dose increase. Check hepatitis a and B antibodies today to see if vaccination required Contact her with results, set clinical reminder for follow-up in 6 months.  Total time 30 minutes, over half spent face-to-face with patient in counseling and coordination of care.   Nelida Meuse III   Results addendum  Primary care labs obtained.  12/04/2017, hemoglobin A1c 6.0 03/06/2017, hemoglobin A1c 6.3, down from 11.3 in September 2018 CMP normal 12/04/2017 Triglycerides 232, total cholesterol 163, HDL 26, LDL 90 on 12/04/2017

## 2018-04-17 LAB — AFP TUMOR MARKER: AFP-Tumor Marker: 3.1 ng/mL

## 2018-04-17 LAB — HEPATITIS B SURFACE ANTIBODY,QUALITATIVE: Hep B S Ab: NONREACTIVE

## 2018-04-17 LAB — HEPATITIS A ANTIBODY, TOTAL: Hepatitis A AB,Total: NONREACTIVE

## 2018-04-19 ENCOUNTER — Other Ambulatory Visit: Payer: Self-pay

## 2018-04-22 ENCOUNTER — Ambulatory Visit (INDEPENDENT_AMBULATORY_CARE_PROVIDER_SITE_OTHER): Payer: BLUE CROSS/BLUE SHIELD | Admitting: Gastroenterology

## 2018-04-22 ENCOUNTER — Ambulatory Visit (HOSPITAL_COMMUNITY)
Admission: RE | Admit: 2018-04-22 | Discharge: 2018-04-22 | Disposition: A | Discharge status: Home / Self Care | Payer: BLUE CROSS/BLUE SHIELD | Source: Ambulatory Visit | Attending: Gastroenterology | Admitting: Gastroenterology

## 2018-04-22 DIAGNOSIS — Z23 Encounter for immunization: Secondary | ICD-10-CM | POA: Diagnosis not present

## 2018-04-22 DIAGNOSIS — K7581 Nonalcoholic steatohepatitis (NASH): Secondary | ICD-10-CM | POA: Diagnosis not present

## 2018-04-22 DIAGNOSIS — K7460 Unspecified cirrhosis of liver: Secondary | ICD-10-CM | POA: Diagnosis not present

## 2018-04-22 DIAGNOSIS — K7689 Other specified diseases of liver: Secondary | ICD-10-CM | POA: Diagnosis not present

## 2018-04-22 NOTE — Progress Notes (Signed)
Administered 1st twinrix inj.  Scheduled 2nd injection with nurse for March 5th (31 days).

## 2018-05-08 IMAGING — US US ABDOMEN COMPLETE W/ ELASTOGRAPHY
1 series · 13 of 25 positions shown · non-contrast
Comparison: 05/19/2015

CLINICAL DATA: Cryptogenic cirrhosis.  Elevated LFTs



[Series 1: us abdomen complete w/ elastography · 0.23mm/px · 13 of 100 slices shown]
[im 1/100]
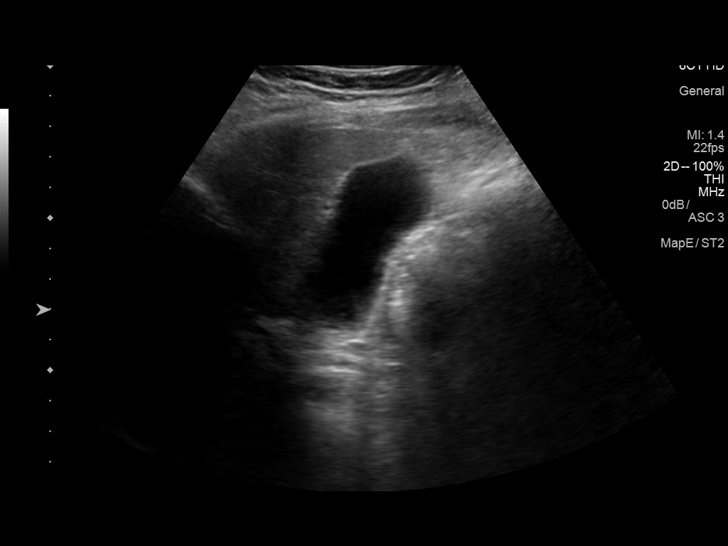
[im 9/100]
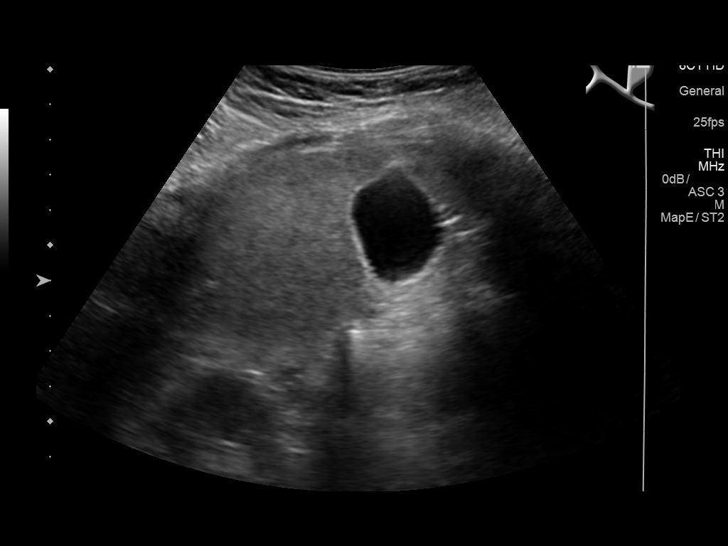
[im 17/100]
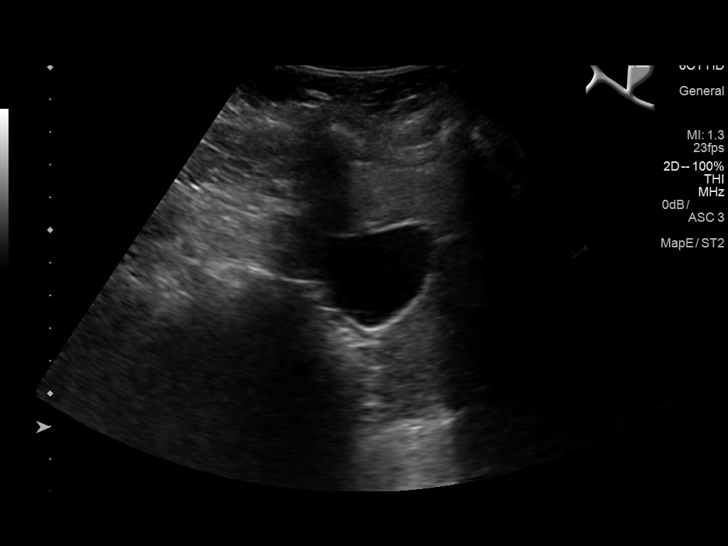
[im 25/100]
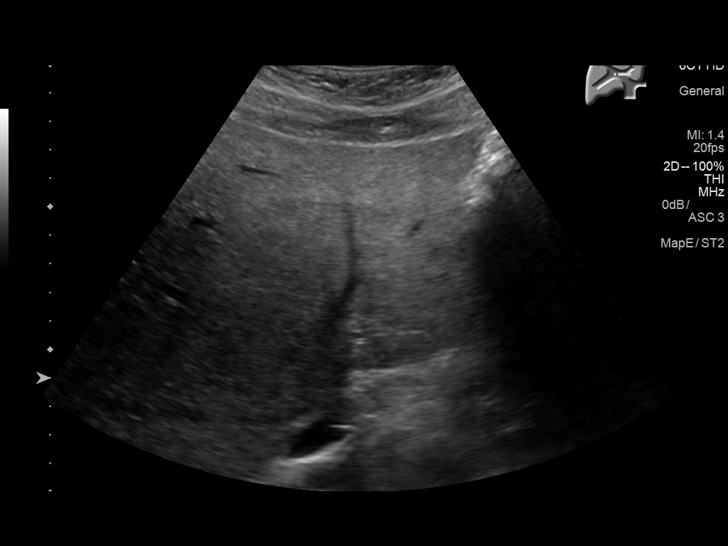
[im 34/100]
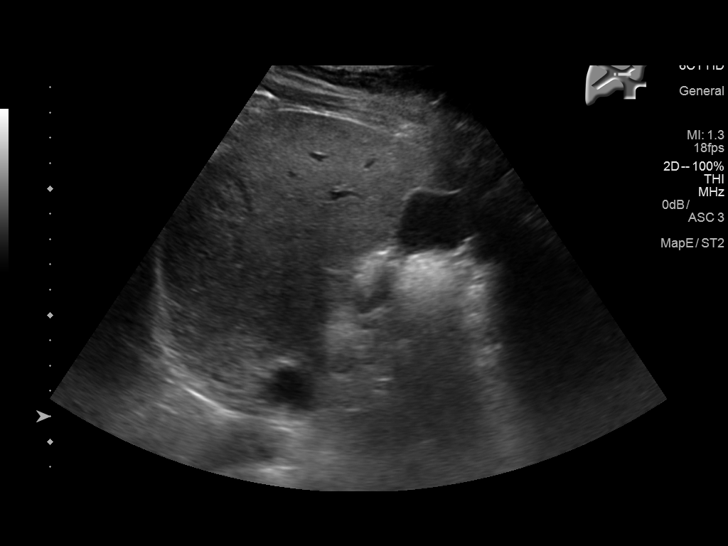
[im 42/100]
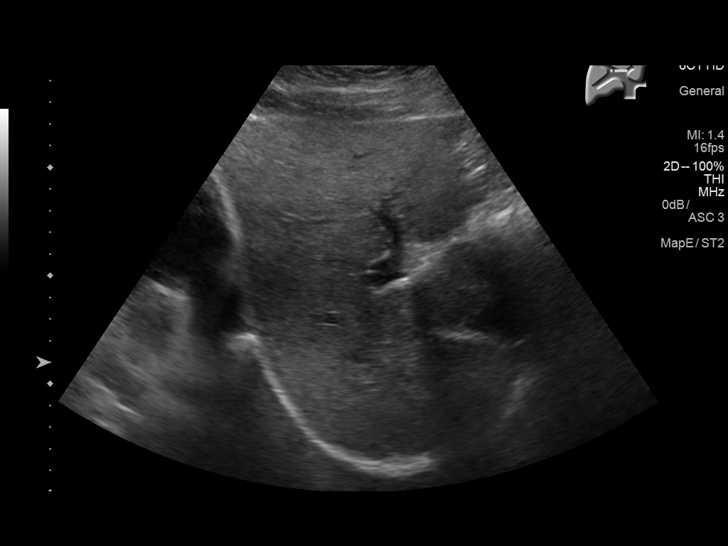
[im 50/100]
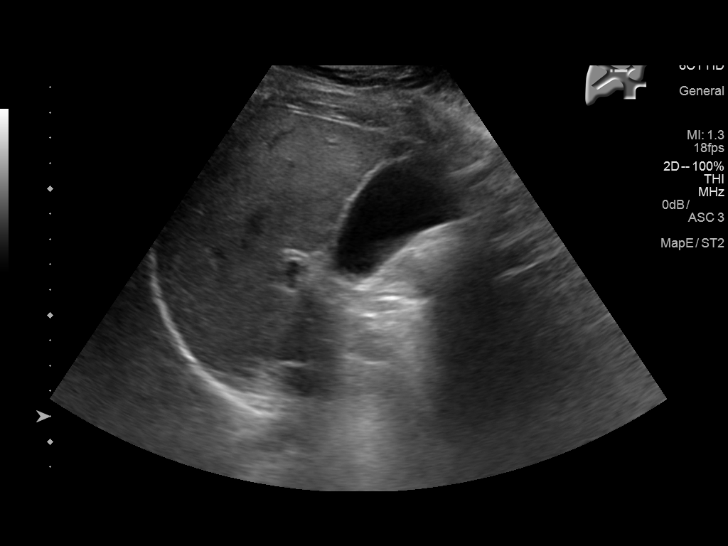
[im 58/100]
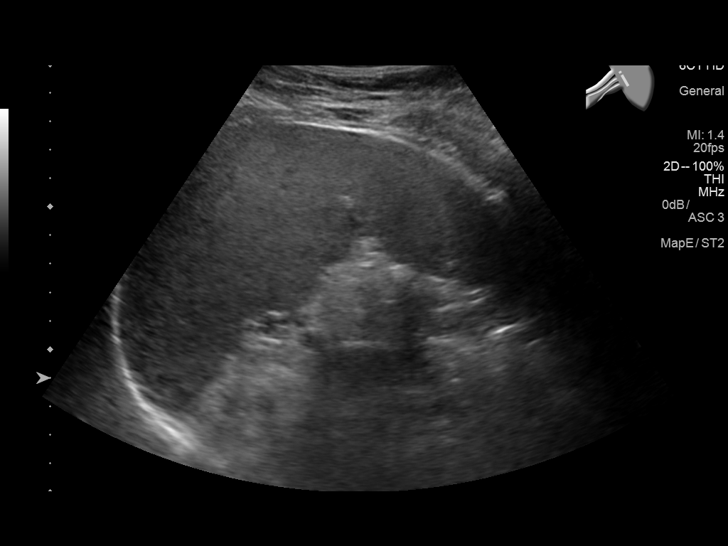
[im 67/100]
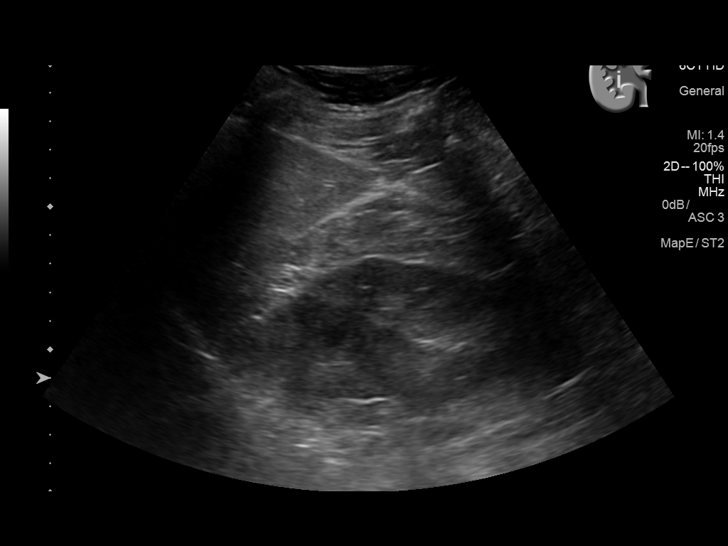
[im 75/100]
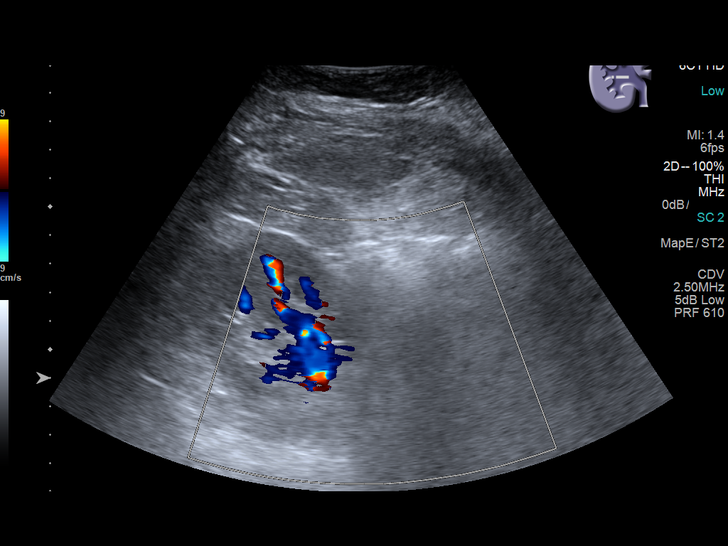
[im 83/100]
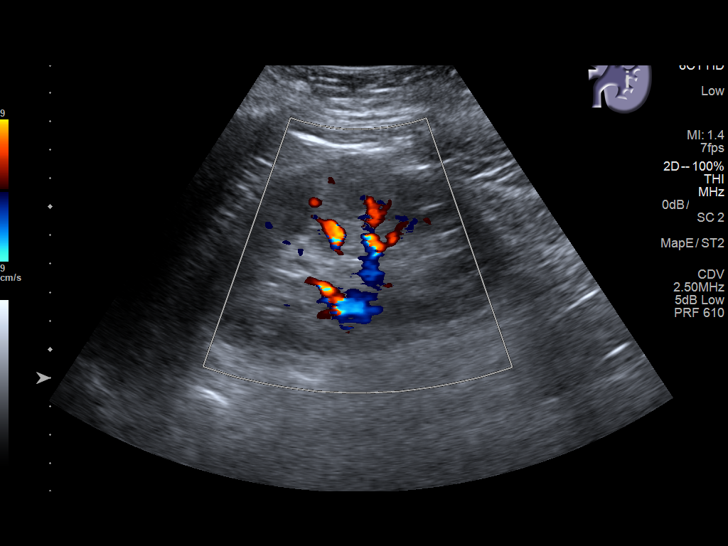
[im 91/100]
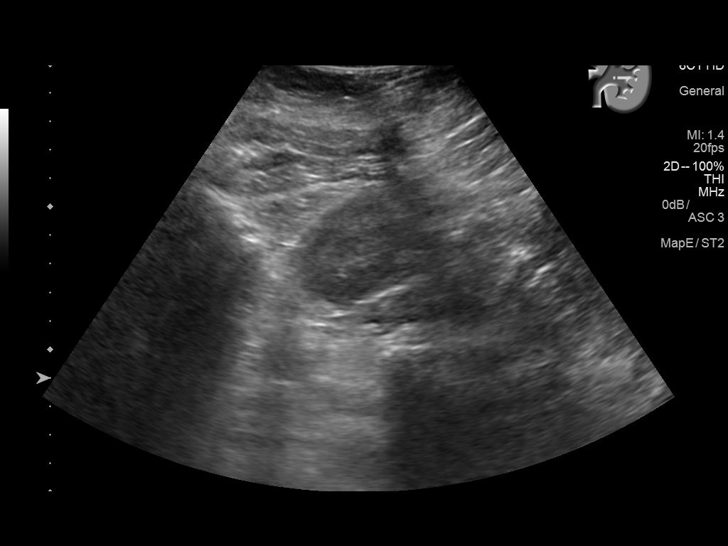
[im 100/100]
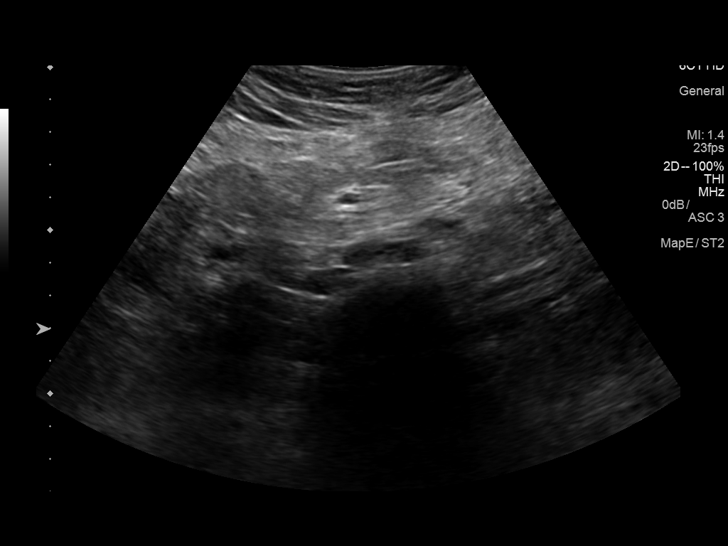

[13 of 25 positions shown; findings below may reference images not displayed]

FINDINGS: ULTRASOUND ABDOMEN

Gallbladder: No gallstones or wall thickening visualized. No
sonographic Murphy sign noted by sonographer.

Common bile duct: Diameter: Normal caliber, 3 mm

Liver: Increased echotexture compatible with fatty infiltration. No
focal abnormality or biliary ductal dilatation. Portal vein is
patent on color Doppler imaging with normal direction of blood flow
towards the liver.

IVC: No abnormality visualized.

Pancreas: Visualized portion unremarkable.

Spleen: Enlarged with a craniocaudal length of 14.2 cm and a volume
of 684 mL.

Right Kidney: Length: 11.7 cm. Echogenicity within normal limits. No
mass or hydronephrosis visualized.

Left Kidney: Length: 10.9 cm. Echogenicity within normal limits. No
mass or hydronephrosis visualized.

Abdominal aorta: No aneurysm visualized.

Other findings: None.

ULTRASOUND HEPATIC ELASTOGRAPHY

Device: Siemens Helix VTQ

Patient position: Left lateral decubitus

Transducer 6C1

Number of measurements: 10

Hepatic segment:  8

Median velocity:   2.71  m/sec

IQR:

IQR/Median velocity ratio:

Corresponding Metavir fibrosis score:  Some F3 + F4

Risk of fibrosis: High

Limitations of exam: None

Pertinent findings noted on other imaging exams:  None

Please note that abnormal shear wave velocities may also be
identified in clinical settings other than with hepatic fibrosis,
such as: acute hepatitis, elevated right heart and central venous
pressures including use of beta blockers, Rookshar disease
([place]), infiltrative processes such as
mastocytosis/amyloidosis/infiltrative tumor, extrahepatic
cholestasis, in the post-prandial state, and liver transplantation.
Correlation with patient history, laboratory data, and clinical
condition recommended.
IMPRESSION: ULTRASOUND ABDOMEN:
Increased echotexture throughout the liver compatible with fatty
infiltration or intrinsic liver disease.

ULTRASOUND HEPATIC ELASTOGRAPHY:

Median hepatic shear wave velocity is calculated at 2.71 m/sec.

Corresponding Metavir fibrosis score is  Some F3 + F4.

Risk of fibrosis is High.

Follow-up: Follow up advised

## 2018-05-21 DIAGNOSIS — F3342 Major depressive disorder, recurrent, in full remission: Secondary | ICD-10-CM | POA: Diagnosis not present

## 2018-05-23 ENCOUNTER — Other Ambulatory Visit: Payer: Self-pay

## 2018-05-23 ENCOUNTER — Ambulatory Visit (INDEPENDENT_AMBULATORY_CARE_PROVIDER_SITE_OTHER): Payer: BLUE CROSS/BLUE SHIELD | Admitting: Gastroenterology

## 2018-05-23 DIAGNOSIS — K7581 Nonalcoholic steatohepatitis (NASH): Secondary | ICD-10-CM

## 2018-05-23 DIAGNOSIS — K7460 Unspecified cirrhosis of liver: Secondary | ICD-10-CM

## 2018-05-23 DIAGNOSIS — Z23 Encounter for immunization: Secondary | ICD-10-CM

## 2018-05-29 DIAGNOSIS — K7460 Unspecified cirrhosis of liver: Secondary | ICD-10-CM | POA: Diagnosis not present

## 2018-05-29 DIAGNOSIS — E1165 Type 2 diabetes mellitus with hyperglycemia: Secondary | ICD-10-CM | POA: Diagnosis not present

## 2018-05-29 DIAGNOSIS — L409 Psoriasis, unspecified: Secondary | ICD-10-CM | POA: Diagnosis not present

## 2018-05-29 DIAGNOSIS — K760 Fatty (change of) liver, not elsewhere classified: Secondary | ICD-10-CM | POA: Diagnosis not present

## 2018-05-29 DIAGNOSIS — F418 Other specified anxiety disorders: Secondary | ICD-10-CM | POA: Diagnosis not present

## 2018-09-12 ENCOUNTER — Other Ambulatory Visit: Payer: Self-pay | Admitting: Nurse Practitioner

## 2018-09-12 ENCOUNTER — Other Ambulatory Visit (HOSPITAL_COMMUNITY)
Admission: RE | Admit: 2018-09-12 | Discharge: 2018-09-12 | Disposition: A | Discharge status: Home / Self Care | Payer: BC Managed Care – PPO | Source: Ambulatory Visit | Attending: Nurse Practitioner | Admitting: Nurse Practitioner

## 2018-09-12 DIAGNOSIS — Z01419 Encounter for gynecological examination (general) (routine) without abnormal findings: Secondary | ICD-10-CM | POA: Diagnosis not present

## 2018-09-12 DIAGNOSIS — Z3202 Encounter for pregnancy test, result negative: Secondary | ICD-10-CM | POA: Diagnosis not present

## 2018-09-13 ENCOUNTER — Other Ambulatory Visit: Payer: Self-pay | Admitting: Nurse Practitioner

## 2018-09-13 DIAGNOSIS — N644 Mastodynia: Secondary | ICD-10-CM

## 2018-09-13 LAB — CYTOLOGY - PAP
Diagnosis: NEGATIVE
HPV: NOT DETECTED

## 2018-09-19 ENCOUNTER — Ambulatory Visit: Payer: BLUE CROSS/BLUE SHIELD

## 2018-09-19 ENCOUNTER — Other Ambulatory Visit: Payer: Self-pay

## 2018-09-19 ENCOUNTER — Ambulatory Visit
Admission: RE | Admit: 2018-09-19 | Discharge: 2018-09-19 | Disposition: A | Discharge status: Home / Self Care | Payer: BC Managed Care – PPO | Source: Ambulatory Visit | Attending: Nurse Practitioner | Admitting: Nurse Practitioner

## 2018-09-19 DIAGNOSIS — R928 Other abnormal and inconclusive findings on diagnostic imaging of breast: Secondary | ICD-10-CM | POA: Diagnosis not present

## 2018-09-19 DIAGNOSIS — N644 Mastodynia: Secondary | ICD-10-CM

## 2018-09-24 ENCOUNTER — Other Ambulatory Visit: Payer: Self-pay

## 2018-09-24 MED ORDER — NADOLOL 20 MG PO TABS: 20.0000 mg | ORAL_TABLET | Freq: Every day | ORAL | 6 refills | Status: DC

## 2018-10-16 DIAGNOSIS — Z20828 Contact with and (suspected) exposure to other viral communicable diseases: Secondary | ICD-10-CM | POA: Diagnosis not present

## 2018-11-04 ENCOUNTER — Ambulatory Visit (INDEPENDENT_AMBULATORY_CARE_PROVIDER_SITE_OTHER): Payer: BC Managed Care – PPO | Admitting: Gastroenterology

## 2018-11-04 DIAGNOSIS — Z23 Encounter for immunization: Secondary | ICD-10-CM

## 2018-11-19 DIAGNOSIS — F3342 Major depressive disorder, recurrent, in full remission: Secondary | ICD-10-CM | POA: Diagnosis not present

## 2019-02-11 DIAGNOSIS — L400 Psoriasis vulgaris: Secondary | ICD-10-CM | POA: Diagnosis not present

## 2019-02-11 DIAGNOSIS — L570 Actinic keratosis: Secondary | ICD-10-CM | POA: Diagnosis not present

## 2019-02-11 DIAGNOSIS — D2239 Melanocytic nevi of other parts of face: Secondary | ICD-10-CM | POA: Diagnosis not present

## 2019-02-11 DIAGNOSIS — D225 Melanocytic nevi of trunk: Secondary | ICD-10-CM | POA: Diagnosis not present

## 2019-03-17 ENCOUNTER — Other Ambulatory Visit: Payer: Self-pay

## 2019-03-17 MED ORDER — NADOLOL 20 MG PO TABS: 20.0000 mg | ORAL_TABLET | Freq: Every day | ORAL | 1 refills | Status: DC

## 2019-03-24 ENCOUNTER — Other Ambulatory Visit: Payer: Self-pay

## 2019-03-24 ENCOUNTER — Telehealth: Payer: Self-pay

## 2019-03-24 NOTE — Telephone Encounter (Signed)
Left a message for patient to return call. Needs a yearly follow up for any additional refills on nadolol.

## 2019-03-26 MED ORDER — NADOLOL 20 MG PO TABS: 20.0000 mg | ORAL_TABLET | Freq: Every day | ORAL | 0 refills | Status: DC

## 2019-03-26 NOTE — Telephone Encounter (Signed)
I refilled it

## 2019-03-26 NOTE — Telephone Encounter (Signed)
Pt is scheduled 04/23/19 OV and requested refills on meds.

## 2019-03-26 NOTE — Telephone Encounter (Signed)
Dr Loletha Carrow. Ok to refill the Nadolol until follow up?

## 2019-04-16 DIAGNOSIS — H524 Presbyopia: Secondary | ICD-10-CM | POA: Diagnosis not present

## 2019-04-16 DIAGNOSIS — H52223 Regular astigmatism, bilateral: Secondary | ICD-10-CM | POA: Diagnosis not present

## 2019-04-16 DIAGNOSIS — H5213 Myopia, bilateral: Secondary | ICD-10-CM | POA: Diagnosis not present

## 2019-04-23 ENCOUNTER — Ambulatory Visit: Payer: BC Managed Care – PPO | Admitting: Gastroenterology

## 2019-04-23 ENCOUNTER — Other Ambulatory Visit (INDEPENDENT_AMBULATORY_CARE_PROVIDER_SITE_OTHER): Payer: BC Managed Care – PPO

## 2019-04-23 ENCOUNTER — Encounter: Payer: Self-pay | Admitting: Gastroenterology

## 2019-04-23 VITALS — BP 116/74 | HR 80 | Temp 97.7°F | Ht 69.0 in | Wt 195.1 lb

## 2019-04-23 DIAGNOSIS — K7460 Unspecified cirrhosis of liver: Secondary | ICD-10-CM

## 2019-04-23 DIAGNOSIS — K7581 Nonalcoholic steatohepatitis (NASH): Secondary | ICD-10-CM | POA: Diagnosis not present

## 2019-04-23 DIAGNOSIS — I864 Gastric varices: Secondary | ICD-10-CM | POA: Diagnosis not present

## 2019-04-23 LAB — CBC WITH DIFFERENTIAL/PLATELET
Basophils Absolute: 0 10*3/uL (ref 0.0–0.1)
Basophils Relative: 0.7 % (ref 0.0–3.0)
Eosinophils Absolute: 0.2 10*3/uL (ref 0.0–0.7)
Eosinophils Relative: 2.7 % (ref 0.0–5.0)
HCT: 46.6 % — ABNORMAL HIGH (ref 36.0–46.0)
Hemoglobin: 16.1 g/dL — ABNORMAL HIGH (ref 12.0–15.0)
Lymphocytes Relative: 33.8 % (ref 12.0–46.0)
Lymphs Abs: 2.3 10*3/uL (ref 0.7–4.0)
MCHC: 34.6 g/dL (ref 30.0–36.0)
MCV: 91.2 fl (ref 78.0–100.0)
Monocytes Absolute: 0.3 10*3/uL (ref 0.1–1.0)
Monocytes Relative: 4.5 % (ref 3.0–12.0)
Neutro Abs: 4 10*3/uL (ref 1.4–7.7)
Neutrophils Relative %: 58.3 % (ref 43.0–77.0)
Platelets: 206 10*3/uL (ref 150.0–400.0)
RBC: 5.1 Mil/uL (ref 3.87–5.11)
RDW: 12 % (ref 11.5–15.5)
WBC: 6.9 10*3/uL (ref 4.0–10.5)

## 2019-04-23 LAB — COMPREHENSIVE METABOLIC PANEL
ALT: 77 U/L — ABNORMAL HIGH (ref 0–35)
AST: 46 U/L — ABNORMAL HIGH (ref 0–37)
Albumin: 4.3 g/dL (ref 3.5–5.2)
Alkaline Phosphatase: 50 U/L (ref 39–117)
BUN: 11 mg/dL (ref 6–23)
CO2: 23 mEq/L (ref 19–32)
Calcium: 10 mg/dL (ref 8.4–10.5)
Chloride: 103 mEq/L (ref 96–112)
Creatinine, Ser: 0.73 mg/dL (ref 0.40–1.20)
GFR: 87.33 mL/min (ref >60.00)
Glucose, Bld: 206 mg/dL — ABNORMAL HIGH (ref 70–99)
Potassium: 3.9 mEq/L (ref 3.5–5.1)
Sodium: 138 mEq/L (ref 135–145)
Total Bilirubin: 0.5 mg/dL (ref 0.2–1.2)
Total Protein: 7.4 g/dL (ref 6.0–8.3)

## 2019-04-23 LAB — PROTIME-INR
INR: 1 ratio (ref 0.8–1.0)
Prothrombin Time: 11.6 s (ref 9.6–13.1)

## 2019-04-23 MED ORDER — NADOLOL 20 MG PO TABS: 20.0000 mg | ORAL_TABLET | Freq: Every day | ORAL | 3 refills | Status: DC

## 2019-04-23 NOTE — Patient Instructions (Signed)
If you are age 43 or older, your body mass index should be between 23-30. Your Body mass index is 28.81 kg/m. If this is out of the aforementioned range listed, please consider follow up with your Primary Care Provider.  If you are age 75 or younger, your body mass index should be between 19-25. Your Body mass index is 28.81 kg/m. If this is out of the aformentioned range listed, please consider follow up with your Primary Care Provider.   You have been scheduled for an abdominal ultrasound at [facility] (1st floor of hospital) on 04-27-18 at 830am. Please arrive 15 minutes prior to your appointment for registration. Make certain not to have anything to eat or drink 6 hours prior to your appointment. Should you need to reschedule your appointment, please contact radiology at [phone]. This test typically takes about 30 minutes to perform.  Your provider has requested that you go to the basement level for lab work before leaving today. Press "B" on the elevator. The lab is located at the first door on the left as you exit the elevator.  Due to recent changes in healthcare laws, you may see the results of your imaging and laboratory studies on MyChart before your provider has had a chance to review them.  We understand that in some cases there may be results that are confusing or concerning to you. Not all laboratory results come back in the same time frame and the provider may be waiting for multiple results in order to interpret others.  Please give [place] 48 hours in order for your provider to thoroughly review all the results before contacting the office for clarification of your results.    It was a pleasure to see you today!  Dr. Loletha Carrow

## 2019-04-23 NOTE — Progress Notes (Signed)
[facility] Progress Note  Chief Complaint: Carmen Nielsen cirrhosis  Subjective  History: Last seen 1 year ago for cirrhosis from either NASH or  "burned out" AIH (minimal inflammation on lats Bx at [facility]), previous work-up at [facility] as outlined in previous notes.  Gastric varices, no prior GI bleed, on primary prophylaxis with nadolol.  Vaccinated for hepatitis A and B last year.  Last hepatoma screening a year ago. Carmen Nielsen has been well since I saw her last year.  No changes in her health, no GI bleeding, no edema or increased abdominal girth, no episodes of confusion.  Seems to be tolerating beta-blocker well.  ROS: Cardiovascular:  no chest pain Respiratory: no dyspnea  The patient's Past Medical, Family and Social History were reviewed and are on file in the EMR.  Objective:  Med list reviewed  Current Outpatient Medications:  .  DULoxetine (CYMBALTA) 60 MG capsule, Take 120 mg by mouth daily., Disp: , Rfl:  .  etonogestrel (NEXPLANON) 68 MG IMPL implant, 1 each by Subdermal route once., Disp: , Rfl:  .  nadolol (CORGARD) 20 MG tablet, Take 1 tablet (20 mg total) by mouth daily. Please schedule a follow up for further refills., Disp: 90 tablet, Rfl: 3   Vital signs in last 24 hrs: Vitals:   04/23/19 0814  BP: 116/74  Pulse: 80  Temp: 97.7 F (36.5 C)    Physical Exam  Well-appearing  HEENT: sclera anicteric, oral mucosa moist without lesions  Neck: supple, no thyromegaly, JVD or lymphadenopathy  Cardiac: RRR without murmurs, S1S2 heard, no peripheral edema  Pulm: clear to auscultation bilaterally, normal RR and effort noted  Abdomen: soft, no tenderness, with active bowel sounds.  No appreciable hepatomegaly but no spleen tip palpable.  No discernible ascites on exam  Skin; warm and dry, no jaundice or rash  Recent Labs:  CBC Latest Ref Rng & Units 04/16/2018 04/13/2017 09/15/2015  WBC 4.0 - 10.5 K/uL 7.1 5.0 6.5  Hemoglobin 12.0 - 15.0 g/dL 16.8(H) 14.7 16.5(H)    Hematocrit 36.0 - 46.0 % 49.2(H) 44.5 46.3(H)  Platelets 150.0 - 400.0 K/uL 223.0 147.0(L) 112(L)   CMP Latest Ref Rng & Units 04/16/2018  Glucose 70 - 99 mg/dL 142(H)  BUN 6 - 23 mg/dL 9  Creatinine 0.40 - 1.20 mg/dL 0.77  Sodium 135 - 145 mEq/L 139  Potassium 3.5 - 5.1 mEq/L 3.9  Chloride 96 - 112 mEq/L 103  CO2 19 - 32 mEq/L 26  Calcium 8.4 - 10.5 mg/dL 10.1  Total Protein 6.0 - 8.3 g/dL 7.2  Total Bilirubin 0.2 - 1.2 mg/dL 0.5  Alkaline Phos 39 - 117 U/L 48  AST 0 - 37 U/L 22  ALT 0 - 35 U/L 25   INR 1.1 and AFP 3.1 in Jan 2020  Radiologic studies:  Last [place] 04/22/18 - no mass  @ASSESSMENTPLANBEGIN @ Assessment: Encounter Diagnoses  Name Primary?  . Cirrhosis of liver without ascites, unspecified hepatic cirrhosis type ([place]) Yes  . NASH (nonalcoholic steatohepatitis)   . Gastric varices    Stable, compensated cirrhosis. Nonbleeding gastric varices found on screening EGD Vaccinated for viral hepatitis   Plan: CBC, CMP, PT/INR, AFP, hepatitis A total antibody and hepatitis B surface antibody to confirm successful vaccination  Right upper quadrant ultrasound for hepatoma screening  Nadolol refilled at current dose.  Though her pulse is about 80, I do not think her blood pressure will tolerate a higher dose.  Follow-up in 6 months or sooner as  needed   25 minutes were spent on this encounter (including chart review, history/exam, counseling/coordination of care, and documentation)  Carmen Nielsen

## 2019-04-24 LAB — AFP TUMOR MARKER: AFP-Tumor Marker: 3.7 ng/mL

## 2019-04-24 LAB — HEPATITIS B SURFACE ANTIBODY,QUALITATIVE: Hep B S Ab: REACTIVE — AB

## 2019-04-24 LAB — HEPATITIS A ANTIBODY, TOTAL: Hepatitis A AB,Total: REACTIVE — AB

## 2019-04-28 ENCOUNTER — Other Ambulatory Visit: Payer: Self-pay

## 2019-04-28 ENCOUNTER — Ambulatory Visit (HOSPITAL_COMMUNITY)
Admission: RE | Admit: 2019-04-28 | Discharge: 2019-04-28 | Disposition: A | Discharge status: Home / Self Care | Payer: BC Managed Care – PPO | Source: Ambulatory Visit | Attending: Gastroenterology | Admitting: Gastroenterology

## 2019-04-28 DIAGNOSIS — K7581 Nonalcoholic steatohepatitis (NASH): Secondary | ICD-10-CM

## 2019-04-28 DIAGNOSIS — I864 Gastric varices: Secondary | ICD-10-CM | POA: Diagnosis not present

## 2019-04-28 DIAGNOSIS — K7460 Unspecified cirrhosis of liver: Secondary | ICD-10-CM | POA: Diagnosis not present

## 2019-04-28 DIAGNOSIS — K760 Fatty (change of) liver, not elsewhere classified: Secondary | ICD-10-CM | POA: Diagnosis not present

## 2019-05-20 DIAGNOSIS — F3342 Major depressive disorder, recurrent, in full remission: Secondary | ICD-10-CM | POA: Diagnosis not present

## 2019-08-04 DIAGNOSIS — K7460 Unspecified cirrhosis of liver: Secondary | ICD-10-CM | POA: Diagnosis not present

## 2019-08-04 DIAGNOSIS — F418 Other specified anxiety disorders: Secondary | ICD-10-CM | POA: Diagnosis not present

## 2019-08-04 DIAGNOSIS — E1165 Type 2 diabetes mellitus with hyperglycemia: Secondary | ICD-10-CM | POA: Diagnosis not present

## 2019-08-04 DIAGNOSIS — Z0000 Encounter for general adult medical examination without abnormal findings: Secondary | ICD-10-CM | POA: Diagnosis not present

## 2019-08-04 DIAGNOSIS — Z23 Encounter for immunization: Secondary | ICD-10-CM | POA: Diagnosis not present

## 2019-11-18 DIAGNOSIS — F3342 Major depressive disorder, recurrent, in full remission: Secondary | ICD-10-CM | POA: Diagnosis not present

## 2020-01-20 ENCOUNTER — Telehealth: Payer: Self-pay | Admitting: Gastroenterology

## 2020-01-20 MED ORDER — NADOLOL 20 MG PO TABS: 20.0000 mg | ORAL_TABLET | Freq: Every day | ORAL | 1 refills | Status: DC

## 2020-01-20 NOTE — Telephone Encounter (Signed)
90 days supply only. Needs a follow up in Feb 2022

## 2020-01-20 NOTE — Telephone Encounter (Signed)
Pt is requesting a refill on her Corgard.   Pharmacy: [facility]

## 2020-02-05 DIAGNOSIS — R739 Hyperglycemia, unspecified: Secondary | ICD-10-CM | POA: Diagnosis not present

## 2020-02-11 DIAGNOSIS — L400 Psoriasis vulgaris: Secondary | ICD-10-CM | POA: Diagnosis not present

## 2020-02-11 DIAGNOSIS — D225 Melanocytic nevi of trunk: Secondary | ICD-10-CM | POA: Diagnosis not present

## 2020-02-11 DIAGNOSIS — D1801 Hemangioma of skin and subcutaneous tissue: Secondary | ICD-10-CM | POA: Diagnosis not present

## 2020-05-18 DIAGNOSIS — F3342 Major depressive disorder, recurrent, in full remission: Secondary | ICD-10-CM | POA: Diagnosis not present

## 2020-05-27 ENCOUNTER — Encounter: Payer: Self-pay | Admitting: *Deleted

## 2020-06-02 ENCOUNTER — Encounter: Payer: Self-pay | Admitting: Gastroenterology

## 2020-06-02 ENCOUNTER — Other Ambulatory Visit (INDEPENDENT_AMBULATORY_CARE_PROVIDER_SITE_OTHER): Payer: BC Managed Care – PPO

## 2020-06-02 ENCOUNTER — Other Ambulatory Visit: Payer: Self-pay

## 2020-06-02 ENCOUNTER — Ambulatory Visit: Payer: BC Managed Care – PPO | Admitting: Gastroenterology

## 2020-06-02 VITALS — BP 100/68 | HR 89 | Ht 68.0 in | Wt 190.0 lb

## 2020-06-02 DIAGNOSIS — K625 Hemorrhage of anus and rectum: Secondary | ICD-10-CM

## 2020-06-02 DIAGNOSIS — I864 Gastric varices: Secondary | ICD-10-CM

## 2020-06-02 DIAGNOSIS — K7460 Unspecified cirrhosis of liver: Secondary | ICD-10-CM

## 2020-06-02 LAB — COMPREHENSIVE METABOLIC PANEL WITH GFR
ALT: 134 U/L — ABNORMAL HIGH (ref 0–35)
AST: 74 U/L — ABNORMAL HIGH (ref 0–37)
Albumin: 4.2 g/dL (ref 3.5–5.2)
Alkaline Phosphatase: 53 U/L (ref 39–117)
BUN: 12 mg/dL (ref 6–23)
CO2: 26 meq/L (ref 19–32)
Calcium: 9.6 mg/dL (ref 8.4–10.5)
Chloride: 103 meq/L (ref 96–112)
Creatinine, Ser: 0.72 mg/dL (ref 0.40–1.20)
GFR: 102.46 mL/min
Glucose, Bld: 214 mg/dL — ABNORMAL HIGH (ref 70–99)
Potassium: 3.6 meq/L (ref 3.5–5.1)
Sodium: 138 meq/L (ref 135–145)
Total Bilirubin: 0.7 mg/dL (ref 0.2–1.2)
Total Protein: 7.1 g/dL (ref 6.0–8.3)

## 2020-06-02 LAB — CBC WITH DIFFERENTIAL/PLATELET
Basophils Absolute: 0 10*3/uL (ref 0.0–0.1)
Basophils Relative: 0.6 % (ref 0.0–3.0)
Eosinophils Absolute: 0.2 10*3/uL (ref 0.0–0.7)
Eosinophils Relative: 3.3 % (ref 0.0–5.0)
HCT: 45.6 % (ref 36.0–46.0)
Hemoglobin: 16.1 g/dL — ABNORMAL HIGH (ref 12.0–15.0)
Lymphocytes Relative: 34.8 % (ref 12.0–46.0)
Lymphs Abs: 2.6 10*3/uL (ref 0.7–4.0)
MCHC: 35.2 g/dL (ref 30.0–36.0)
MCV: 89.9 fl (ref 78.0–100.0)
Monocytes Absolute: 0.4 10*3/uL (ref 0.1–1.0)
Monocytes Relative: 4.7 % (ref 3.0–12.0)
Neutro Abs: 4.3 10*3/uL (ref 1.4–7.7)
Neutrophils Relative %: 56.6 % (ref 43.0–77.0)
Platelets: 191 10*3/uL (ref 150.0–400.0)
RBC: 5.08 Mil/uL (ref 3.87–5.11)
RDW: 12.3 % (ref 11.5–15.5)
WBC: 7.6 10*3/uL (ref 4.0–10.5)

## 2020-06-02 LAB — PROTIME-INR
INR: 1.2 ratio — ABNORMAL HIGH (ref 0.8–1.0)
Prothrombin Time: 13 s (ref 9.6–13.1)

## 2020-06-02 NOTE — Patient Instructions (Addendum)
Your provider has requested that you go to the basement level for lab work before leaving today. Press "B" on the elevator. The lab is located at the first door on the left as you exit the elevator.  You will be contacted by [facility] in the next 2 days to arrange a limited right upper quadrant ultrasound.  The number on your caller ID will be [phone], please answer when they call.  If you have not heard from them in 2 days please call [phone] to schedule.    Please follow up with Dr Loletha Carrow in 6 months in the office.  If you are age 21 or younger, your body mass index should be between 19-25. Your Body mass index is 28.89 kg/m. If this is out of the aformentioned range listed, please consider follow up with your Primary Care Provider.   Due to recent changes in healthcare laws, you may see the results of your imaging and laboratory studies on MyChart before your provider has had a chance to review them.  We understand that in some cases there may be results that are confusing or concerning to you. Not all laboratory results come back in the same time frame and the provider may be waiting for multiple results in order to interpret others.  Please give [place] 48 hours in order for your provider to thoroughly review all the results before contacting the office for clarification of your results.   It was a pleasure to see you today!  Dr. Loletha Carrow

## 2020-06-02 NOTE — Progress Notes (Signed)
[facility] Progress Note  Chief Complaint: Cirrhosis  Subjective  History: From my last office note on 04/23/2019: "Last seen 1 year ago for cirrhosis from either NASH or  "burned out" AIH (minimal inflammation on lats Bx at [facility]), previous work-up at [facility] as outlined in previous notes.  Gastric varices, no prior GI bleed, on primary prophylaxis with nadolol.  Vaccinated for hepatitis A and B last year.  Last hepatoma screening a year ago.    Stable, compensated cirrhosis. Nonbleeding gastric varices found on screening EGD Vaccinated for viral hepatitis     Plan: CBC, CMP, PT/INR, AFP, hepatitis A total antibody and hepatitis B surface antibody to confirm successful vaccination   Right upper quadrant ultrasound for hepatoma screening   Nadolol refilled at current dose.  Though her pulse is about 80, I do not think her blood pressure will tolerate a higher dose.   Follow-up in 6 months or sooner as needed"  _________________________________________________  Carmen Nielsen was here for routine follow-up of her cirrhosis.  Her health has been good and she generally feels well.  She denies chronic abdominal pain, altered bowel habits, nausea or vomiting. She wonders if she may have hemorrhoids, because she will sometimes feel as if there is a swelling in the perianal area.  And on 2 occasions, most recently 6 months ago, she had acute onset severe anorectal pain that resolved after it felt like something "ruptured" with bleeding.  Symptoms resolved almost immediately after that.  ROS: Cardiovascular:  no chest pain Respiratory: no dyspnea Mood stable Remainder of systems negative except as above The patient's Past Medical, Family and Social History were reviewed and are on file in the EMR.  Objective:  Med list reviewed  Current Outpatient Medications:  .  CINNAMON PO, Take by mouth., Disp: , Rfl:  .  DULoxetine (CYMBALTA) 60 MG capsule, Take 120 mg by mouth daily., Disp: ,  Rfl:  .  etonogestrel (NEXPLANON) 68 MG IMPL implant, 1 each by Subdermal route once., Disp: , Rfl:  .  Multiple Vitamin (MULTIVITAMIN) tablet, Take 1 tablet by mouth daily., Disp: , Rfl:  .  nadolol (CORGARD) 20 MG tablet, Take 1 tablet (20 mg total) by mouth daily. Please schedule a follow up for further refills., Disp: 90 tablet, Rfl: 1 .  vitamin B-12 (CYANOCOBALAMIN) 500 MCG tablet, Take 500 mcg by mouth daily., Disp: , Rfl:    Vital signs in last 24 hrs: Vitals:   06/02/20 1519  BP: 100/68  Pulse: 89   Wt Readings from Last 3 Encounters:  06/02/20 190 lb (86.2 kg)  04/23/19 195 lb 2 oz (88.5 kg)  04/16/18 189 lb 9.6 oz (86 kg)    Physical Exam  She is well-appearing  HEENT: sclera anicteric, oral mucosa moist without lesions  Neck: supple, no thyromegaly, JVD or lymphadenopathy  Cardiac: RRR without murmurs, S1S2 heard, no peripheral edema  Pulm: clear to auscultation bilaterally, normal RR and effort noted  Abdomen: soft, no tenderness, with active bowel sounds. No guarding or palpable hepatosplenomegaly.  No distention or bulging flanks  Skin; warm and dry, no jaundice or rash She declined a rectal exam today Labs:  CBC Latest Ref Rng & Units 04/23/2019 04/16/2018 04/13/2017  WBC 4.0 - 10.5 K/uL 6.9 7.1 5.0  Hemoglobin 12.0 - 15.0 g/dL 16.1(H) 16.8(H) 14.7  Hematocrit 36.0 - 46.0 % 46.6(H) 49.2(H) 44.5  Platelets 150.0 - 400.0 K/uL 206.0 223.0 147.0(L)   CMP Latest Ref Rng & Units 04/23/2019  04/16/2018  Glucose 70 - 99 mg/dL 206(H) 142(H)  BUN 6 - 23 mg/dL 11 9  Creatinine 0.40 - 1.20 mg/dL 0.73 0.77  Sodium 135 - 145 mEq/L 138 139  Potassium 3.5 - 5.1 mEq/L 3.9 3.9  Chloride 96 - 112 mEq/L 103 103  CO2 19 - 32 mEq/L 23 26  Calcium 8.4 - 10.5 mg/dL 10.0 10.1  Total Protein 6.0 - 8.3 g/dL 7.4 7.2  Total Bilirubin 0.2 - 1.2 mg/dL 0.5 0.5  Alkaline Phos 39 - 117 U/L 50 48  AST 0 - 37 U/L 46(H) 22  ALT 0 - 35 U/L 77(H) 25   Lab Results  Component Value Date    INR 1.0 04/23/2019   INR 1.1 (H) 04/16/2018   INR 1.2 (H) 04/13/2017   Alpha-fetoprotein 3.27 April 2019  Hepatitis a total antibody and hepatitis B surface antibody both reactive, indicating immunity from prior vaccination. ___________________________________________ Radiologic studies:  CLINICAL DATA:  Cirrhosis, [facility]   EXAM: ULTRASOUND ABDOMEN LIMITED RIGHT UPPER QUADRANT   COMPARISON:  04/22/2018   FINDINGS: Gallbladder:   No gallstones or wall thickening visualized. No sonographic Murphy sign noted by sonographer.   Common bile duct:   Diameter: 4 mm   Liver:   Mild increased echogenicity compatible with early hepatic steatosis. No focal hepatic abnormality or intrahepatic biliary dilatation. No surrounding free fluid or ascites. Portal vein is patent on color Doppler imaging with normal direction of blood flow towards the liver.   Other: Included views of the right kidney demonstrate no hydronephrosis.   IMPRESSION: Hepatic steatosis.  No other acute finding by ultrasound.     Electronically Signed   By: Jerilynn Mages.  Shick M.D.   On: 04/28/2019 08:56  ____________________________________________ Other:   _____________________________________________ Assessment & Plan  Assessment: Encounter Diagnosis  Name Primary?  . Hepatic cirrhosis, unspecified hepatic cirrhosis type, unspecified whether ascites present ([place]) Yes    Compensated cirrhosis, looking and feeling well, need of routine lab and ultrasound follow-up/hepatoma screening.  On primary prophylaxis for gastric varices.  She is not at target heart rate, but I do not think her blood pressure will tolerate an increase of the nadolol.  Her anorectal symptoms sound like probable prolapsed and been swollen internal hemorrhoids relieved after bleeding occurs.  She most likely does have internal hemorrhoids and she is cirrhotic and is known to have upper GI varices.  Her bowel habits are generally regular  and she tries to avoid straining with bowel movements.   Anal fissure sounds less likely given the degree of bleeding  She asked what to do with the next episode, and I recommended she call us to see if we have availability see her immediately.  Also recommend that she manually reduce any prolapsed hemorrhoid tissue and then use a Preparation H suppository, with another later in the day if needed. Daily fiber supplement, avoid straining.  Plan: Labs today: CBC, CMP, AFP, INR Right upper quadrant ultrasound for hepatoma screening  See me in 6 months or sooner as needed  30 minutes were spent on this encounter (including chart review, history/exam, counseling/coordination of care, and documentation) > 50% of that time was spent on counseling and coordination of care.  Topics discussed included: Anorectal bleeding, cirrhosis and its monitoring.  Nelida Meuse III

## 2020-06-03 LAB — AFP TUMOR MARKER: AFP-Tumor Marker: 3.7 ng/mL

## 2020-06-09 ENCOUNTER — Other Ambulatory Visit: Payer: Self-pay

## 2020-06-09 ENCOUNTER — Ambulatory Visit (HOSPITAL_COMMUNITY)
Admission: RE | Admit: 2020-06-09 | Discharge: 2020-06-09 | Disposition: A | Discharge status: Home / Self Care | Payer: BC Managed Care – PPO | Source: Ambulatory Visit | Attending: Gastroenterology | Admitting: Gastroenterology

## 2020-06-09 DIAGNOSIS — K7460 Unspecified cirrhosis of liver: Secondary | ICD-10-CM

## 2020-06-09 DIAGNOSIS — K760 Fatty (change of) liver, not elsewhere classified: Secondary | ICD-10-CM | POA: Diagnosis not present

## 2020-06-25 ENCOUNTER — Telehealth: Payer: Self-pay

## 2020-06-25 NOTE — Telephone Encounter (Signed)
Patient has been scheduled for an appointment at [facility] in [place] on Monday, 08/02/20 at 2:15 PM.

## 2020-07-15 ENCOUNTER — Other Ambulatory Visit: Payer: Self-pay | Admitting: Gastroenterology

## 2020-08-02 DIAGNOSIS — R7989 Other specified abnormal findings of blood chemistry: Secondary | ICD-10-CM | POA: Diagnosis not present

## 2020-08-02 DIAGNOSIS — E119 Type 2 diabetes mellitus without complications: Secondary | ICD-10-CM | POA: Insufficient documentation

## 2020-08-02 DIAGNOSIS — I8510 Secondary esophageal varices without bleeding: Secondary | ICD-10-CM | POA: Diagnosis not present

## 2020-08-02 DIAGNOSIS — K7469 Other cirrhosis of liver: Secondary | ICD-10-CM | POA: Diagnosis not present

## 2020-11-11 DIAGNOSIS — F418 Other specified anxiety disorders: Secondary | ICD-10-CM | POA: Diagnosis not present

## 2020-11-11 DIAGNOSIS — Z0000 Encounter for general adult medical examination without abnormal findings: Secondary | ICD-10-CM | POA: Diagnosis not present

## 2020-11-11 DIAGNOSIS — Z124 Encounter for screening for malignant neoplasm of cervix: Secondary | ICD-10-CM | POA: Diagnosis not present

## 2020-11-11 DIAGNOSIS — K7460 Unspecified cirrhosis of liver: Secondary | ICD-10-CM | POA: Diagnosis not present

## 2020-11-11 DIAGNOSIS — L409 Psoriasis, unspecified: Secondary | ICD-10-CM | POA: Diagnosis not present

## 2020-11-11 DIAGNOSIS — E1165 Type 2 diabetes mellitus with hyperglycemia: Secondary | ICD-10-CM | POA: Diagnosis not present

## 2020-11-11 DIAGNOSIS — K760 Fatty (change of) liver, not elsewhere classified: Secondary | ICD-10-CM | POA: Diagnosis not present

## 2020-11-16 ENCOUNTER — Other Ambulatory Visit: Payer: Self-pay | Admitting: Family Medicine

## 2020-11-16 DIAGNOSIS — Z1231 Encounter for screening mammogram for malignant neoplasm of breast: Secondary | ICD-10-CM

## 2020-11-18 ENCOUNTER — Other Ambulatory Visit: Payer: Self-pay

## 2020-11-18 ENCOUNTER — Ambulatory Visit
Admission: RE | Admit: 2020-11-18 | Discharge: 2020-11-18 | Disposition: A | Discharge status: Home / Self Care | Payer: BC Managed Care – PPO | Source: Ambulatory Visit | Attending: Family Medicine | Admitting: Family Medicine

## 2020-11-18 DIAGNOSIS — F5105 Insomnia due to other mental disorder: Secondary | ICD-10-CM | POA: Diagnosis not present

## 2020-11-18 DIAGNOSIS — Z1231 Encounter for screening mammogram for malignant neoplasm of breast: Secondary | ICD-10-CM

## 2020-11-18 DIAGNOSIS — F3181 Bipolar II disorder: Secondary | ICD-10-CM | POA: Diagnosis not present

## 2020-11-24 ENCOUNTER — Other Ambulatory Visit: Payer: Self-pay | Admitting: Family Medicine

## 2020-11-24 DIAGNOSIS — R928 Other abnormal and inconclusive findings on diagnostic imaging of breast: Secondary | ICD-10-CM

## 2020-11-29 DIAGNOSIS — Z6827 Body mass index (BMI) 27.0-27.9, adult: Secondary | ICD-10-CM | POA: Diagnosis not present

## 2020-11-29 DIAGNOSIS — R87610 Atypical squamous cells of undetermined significance on cytologic smear of cervix (ASC-US): Secondary | ICD-10-CM | POA: Diagnosis not present

## 2020-11-29 DIAGNOSIS — E1165 Type 2 diabetes mellitus with hyperglycemia: Secondary | ICD-10-CM | POA: Diagnosis not present

## 2020-12-07 IMAGING — US US ABDOMEN LIMITED
1 series · 14 of 25 positions shown · non-contrast
Comparison: Ultrasound dated 05/19/2015

CLINICAL DATA: Non alcoholic steatohepatitis ([facility]).  Cirrhosis.

EXAM:
ULTRASOUND ABDOMEN LIMITED RIGHT UPPER QUADRANT

[Series 1: us abdomen limited · 14 of 49 slices shown]
[im 1/49]
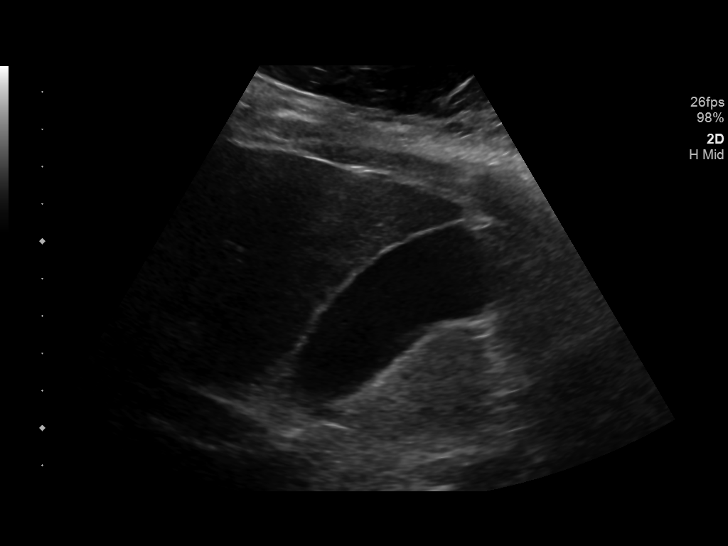
[im 5/49]
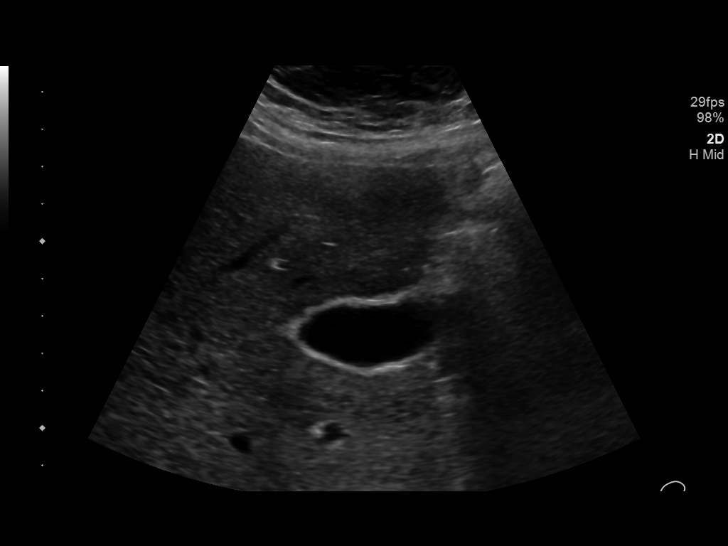
[im 9/49]
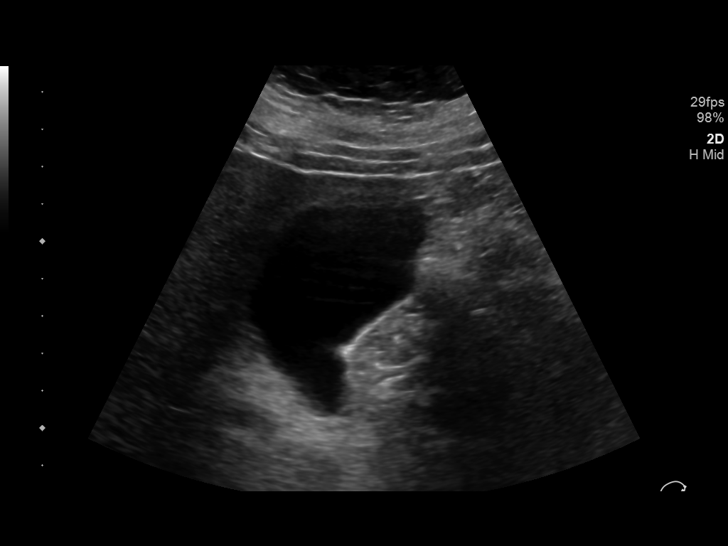
[im 13/49]
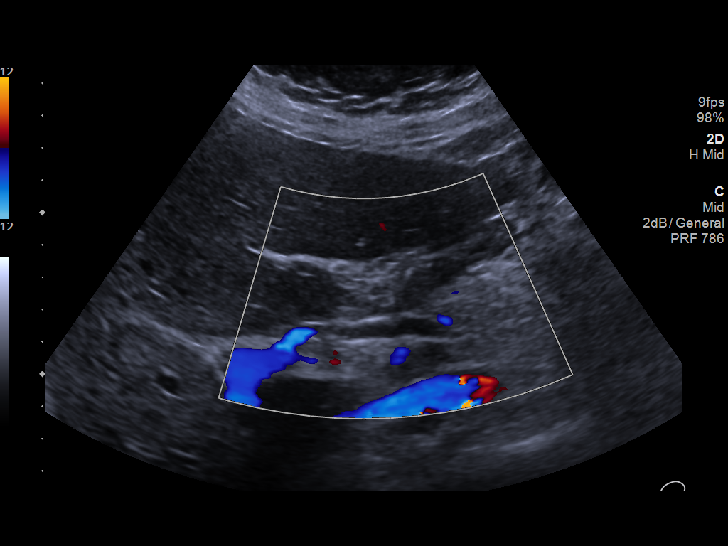
[im 17/49]
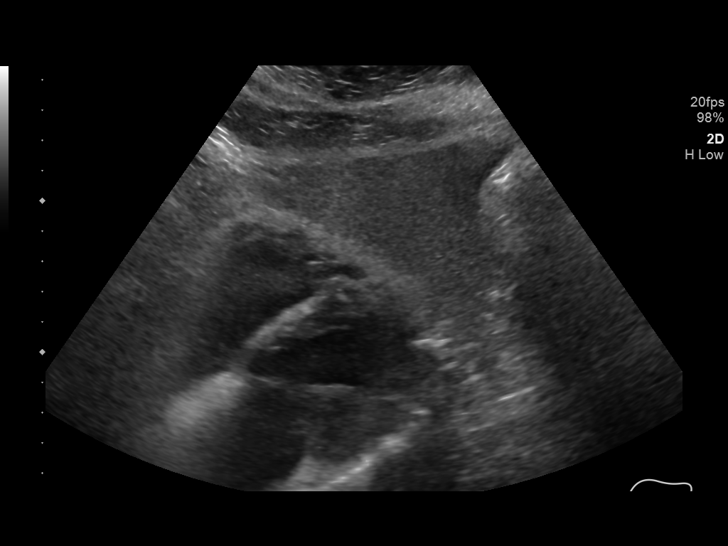
[im 19/49]
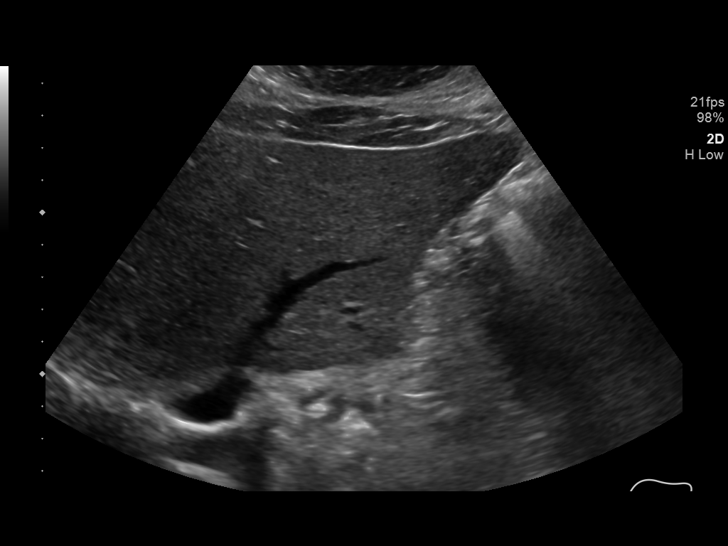
[im 23/49]
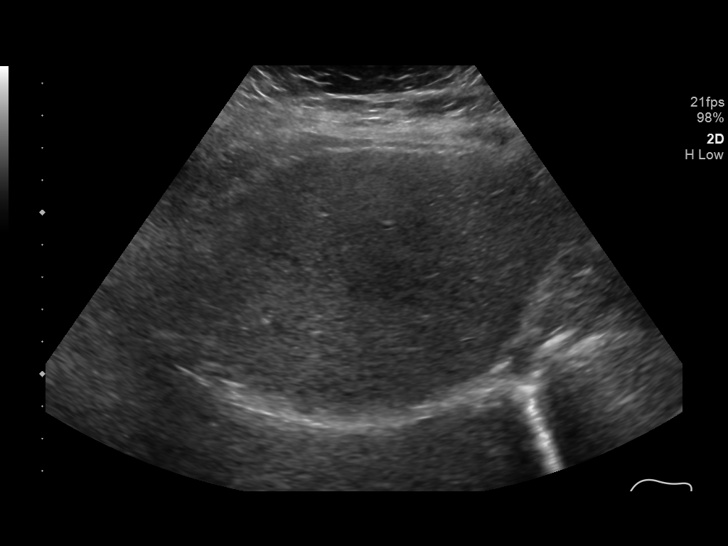
[im 27/49]
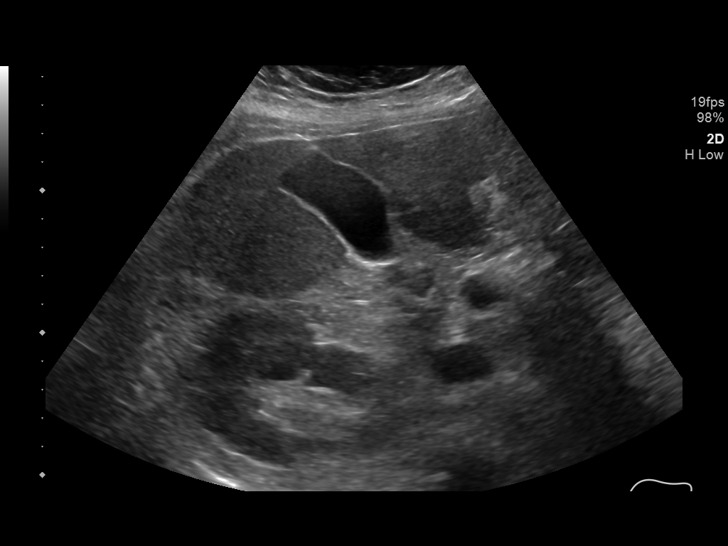
[im 31/49]
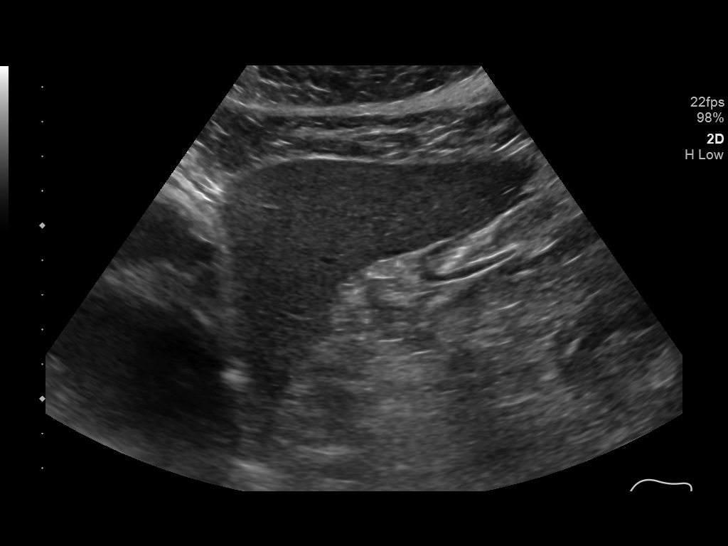
[im 33/49]
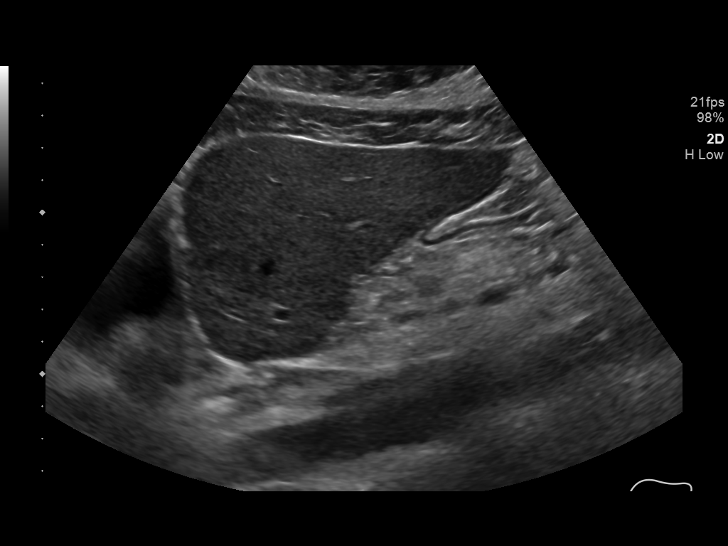
[im 37/49]
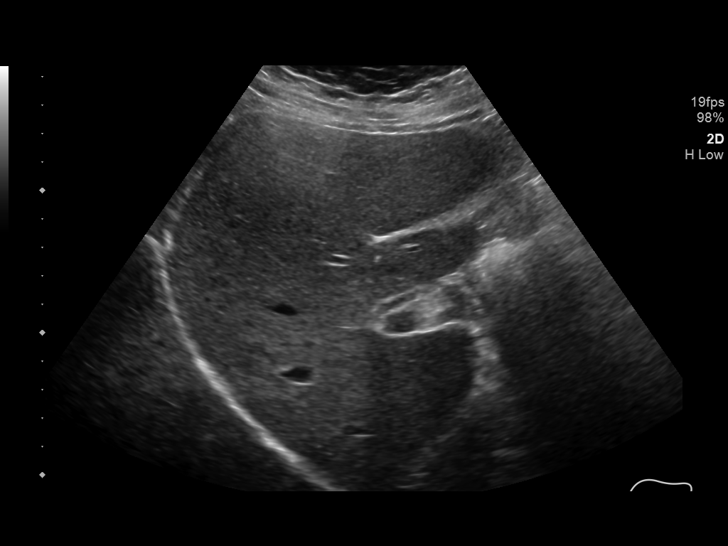
[im 41/49]
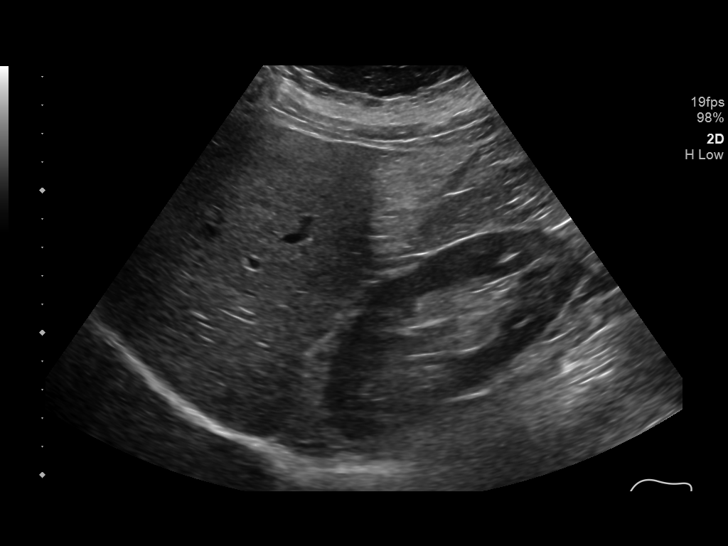
[im 45/49]
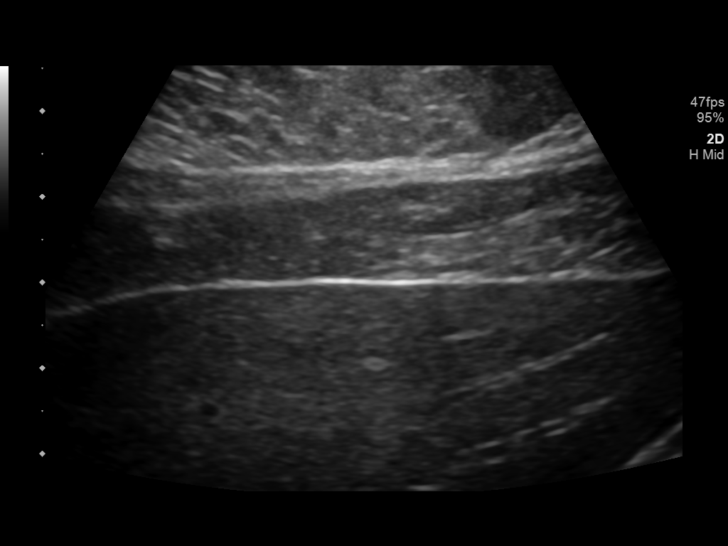
[im 49/49]
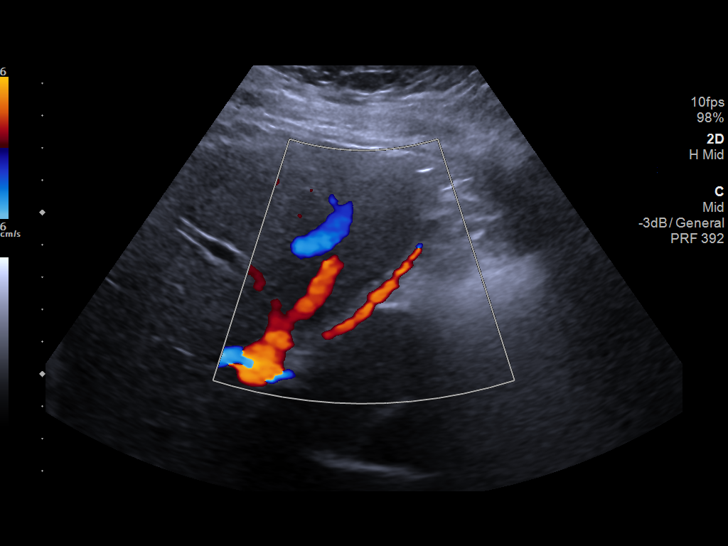

[14 of 25 positions shown; findings below may reference images not displayed]

FINDINGS: Gallbladder:

No gallstones or wall thickening visualized. No sonographic Murphy
sign noted by sonographer.

Common bile duct:

Diameter: 4.7 mm, normal

Liver:

No focal lesion identified. There is slight increased echogenicity
of the liver parenchyma. Portal vein is patent on color Doppler
imaging with normal direction of blood flow towards the liver.

A recanalized umbilical vein is noted.
IMPRESSION: 1. Slight increased echogenicity of the liver parenchyma consistent
with hepatic steatosis.
2. Normal flow in the portal vein.
3. Recanalized umbilical vein which can be seen with portal
hypertension.

## 2020-12-09 ENCOUNTER — Ambulatory Visit
Admission: RE | Admit: 2020-12-09 | Discharge: 2020-12-09 | Disposition: A | Discharge status: Home / Self Care | Payer: BC Managed Care – PPO | Source: Ambulatory Visit | Attending: Family Medicine | Admitting: Family Medicine

## 2020-12-09 ENCOUNTER — Other Ambulatory Visit: Payer: Self-pay

## 2020-12-09 DIAGNOSIS — R928 Other abnormal and inconclusive findings on diagnostic imaging of breast: Secondary | ICD-10-CM

## 2020-12-09 DIAGNOSIS — N6002 Solitary cyst of left breast: Secondary | ICD-10-CM | POA: Diagnosis not present

## 2020-12-09 DIAGNOSIS — R922 Inconclusive mammogram: Secondary | ICD-10-CM | POA: Diagnosis not present

## 2020-12-09 DIAGNOSIS — N6322 Unspecified lump in the left breast, upper inner quadrant: Secondary | ICD-10-CM | POA: Diagnosis not present

## 2020-12-20 DIAGNOSIS — F17200 Nicotine dependence, unspecified, uncomplicated: Secondary | ICD-10-CM | POA: Diagnosis not present

## 2020-12-20 DIAGNOSIS — J45901 Unspecified asthma with (acute) exacerbation: Secondary | ICD-10-CM | POA: Diagnosis not present

## 2020-12-20 DIAGNOSIS — R059 Cough, unspecified: Secondary | ICD-10-CM | POA: Diagnosis not present

## 2021-01-05 ENCOUNTER — Other Ambulatory Visit: Payer: Self-pay | Admitting: Gastroenterology

## 2021-02-02 DIAGNOSIS — R7989 Other specified abnormal findings of blood chemistry: Secondary | ICD-10-CM | POA: Diagnosis not present

## 2021-02-02 DIAGNOSIS — K7469 Other cirrhosis of liver: Secondary | ICD-10-CM | POA: Diagnosis not present

## 2021-02-04 ENCOUNTER — Other Ambulatory Visit: Payer: Self-pay | Admitting: Nurse Practitioner

## 2021-02-04 DIAGNOSIS — K7469 Other cirrhosis of liver: Secondary | ICD-10-CM

## 2021-02-07 DIAGNOSIS — K7469 Other cirrhosis of liver: Secondary | ICD-10-CM | POA: Diagnosis not present

## 2021-02-09 ENCOUNTER — Other Ambulatory Visit: Payer: Self-pay

## 2021-02-09 ENCOUNTER — Ambulatory Visit
Admission: RE | Admit: 2021-02-09 | Discharge: 2021-02-09 | Disposition: A | Discharge status: Home / Self Care | Payer: BC Managed Care – PPO | Source: Ambulatory Visit | Attending: Nurse Practitioner | Admitting: Nurse Practitioner

## 2021-02-09 DIAGNOSIS — K7469 Other cirrhosis of liver: Secondary | ICD-10-CM | POA: Diagnosis not present

## 2021-02-09 DIAGNOSIS — K760 Fatty (change of) liver, not elsewhere classified: Secondary | ICD-10-CM | POA: Diagnosis not present

## 2021-02-18 DIAGNOSIS — L400 Psoriasis vulgaris: Secondary | ICD-10-CM | POA: Diagnosis not present

## 2021-02-18 DIAGNOSIS — L738 Other specified follicular disorders: Secondary | ICD-10-CM | POA: Diagnosis not present

## 2021-02-18 DIAGNOSIS — D225 Melanocytic nevi of trunk: Secondary | ICD-10-CM | POA: Diagnosis not present

## 2021-02-28 DIAGNOSIS — E785 Hyperlipidemia, unspecified: Secondary | ICD-10-CM | POA: Diagnosis not present

## 2021-02-28 DIAGNOSIS — L409 Psoriasis, unspecified: Secondary | ICD-10-CM | POA: Diagnosis not present

## 2021-02-28 DIAGNOSIS — K7460 Unspecified cirrhosis of liver: Secondary | ICD-10-CM | POA: Diagnosis not present

## 2021-02-28 DIAGNOSIS — Z6825 Body mass index (BMI) 25.0-25.9, adult: Secondary | ICD-10-CM | POA: Diagnosis not present

## 2021-02-28 DIAGNOSIS — K760 Fatty (change of) liver, not elsewhere classified: Secondary | ICD-10-CM | POA: Diagnosis not present

## 2021-02-28 DIAGNOSIS — E1165 Type 2 diabetes mellitus with hyperglycemia: Secondary | ICD-10-CM | POA: Diagnosis not present

## 2021-05-23 ENCOUNTER — Telehealth: Payer: Self-pay | Admitting: Gastroenterology

## 2021-05-23 NOTE — Telephone Encounter (Signed)
"  I have a thrombosed external hemorrhoid.  It started yesterday (Saturday, March 5th).  I am in a lot of pain and the OTC treatments are not really helping.  Dr. Loletha Carrow told me that I should see him if/when this happened." ?

## 2021-05-23 NOTE — Telephone Encounter (Signed)
Returned call to patient. Per last office note, Dr. Loletha Carrow recommended that she call us to see if we have availability see her immediately for this issues. Unfortunately, our next available appt is not until Wednesday and that is with an APP. Dr. Loletha Carrow does not have any availability this week.  I told pt that if she is in a lot of pain I would recommend that she go to an urgent care for evaluation today. Pt verbalized understanding and was thankful for the return call. ?

## 2021-07-04 DIAGNOSIS — E1165 Type 2 diabetes mellitus with hyperglycemia: Secondary | ICD-10-CM | POA: Diagnosis not present

## 2021-07-04 DIAGNOSIS — E785 Hyperlipidemia, unspecified: Secondary | ICD-10-CM | POA: Diagnosis not present

## 2021-08-03 ENCOUNTER — Other Ambulatory Visit: Payer: Self-pay | Admitting: Nurse Practitioner

## 2021-08-03 DIAGNOSIS — K7469 Other cirrhosis of liver: Secondary | ICD-10-CM | POA: Diagnosis not present

## 2021-08-03 DIAGNOSIS — I8510 Secondary esophageal varices without bleeding: Secondary | ICD-10-CM | POA: Diagnosis not present

## 2021-08-03 DIAGNOSIS — R7989 Other specified abnormal findings of blood chemistry: Secondary | ICD-10-CM | POA: Diagnosis not present

## 2021-08-03 DIAGNOSIS — K649 Unspecified hemorrhoids: Secondary | ICD-10-CM | POA: Diagnosis not present

## 2021-08-04 ENCOUNTER — Telehealth: Payer: Self-pay | Admitting: Gastroenterology

## 2021-08-04 NOTE — Telephone Encounter (Signed)
Left message to return call and confirm the follow up scheduled for 08-09-2021.

## 2021-08-04 NOTE — Telephone Encounter (Signed)
Patient called to confirm appointment 08/09/21.

## 2021-08-04 NOTE — Telephone Encounter (Signed)
This is a patient of mine with cirrhosis.  The note I received from her recent visit to the [facility] hepatology clinic indicates she is having ongoing hemorrhoidal bleeding.  Please arrange an office visit if she is willing to let me examine the area.  HD

## 2021-08-09 ENCOUNTER — Ambulatory Visit: Payer: BC Managed Care – PPO | Admitting: Gastroenterology

## 2021-08-09 ENCOUNTER — Encounter: Payer: Self-pay | Admitting: Gastroenterology

## 2021-08-09 VITALS — BP 100/70 | HR 108 | Ht 68.0 in | Wt 175.0 lb

## 2021-08-09 DIAGNOSIS — K625 Hemorrhage of anus and rectum: Secondary | ICD-10-CM | POA: Diagnosis not present

## 2021-08-09 DIAGNOSIS — K7460 Unspecified cirrhosis of liver: Secondary | ICD-10-CM

## 2021-08-09 DIAGNOSIS — I864 Gastric varices: Secondary | ICD-10-CM | POA: Diagnosis not present

## 2021-08-09 NOTE — Progress Notes (Signed)
     [facility] Progress Note  Chief Complaint: Hemorrhoidal bleeding  Subjective  History: Carmen Nielsen was seen in the office for the first time since 06/02/2020. She sees me for cirrhosis that is most likely either burned-out [facility] or AIH, and has also been seen in the [facility] hepatology clinic for this.  Reviewing their most recent note from 08/03/2021 that was forwarded to me, her liver condition is stable and she remains on nadolol for primary gastric variceal prophylaxis after that was discovered in 2019 (large G OV 1).  She was also describing intermittent rectal bleeding believed to be hemorrhoids, something she had described when I last saw her.  Her MELD score is 8 and she is up-to-date on hepatoma screening.  Compensated condition with no ascites or encephalopathy with preserved liver synthetic function.  Carmen Nielsen describes episodes that happen every few months of a sudden onset swelling and intense pain in the anal area with a "cluster of grapes" feeling in appearance that would last for days before something finally "ruptures" and there is bleeding with relief.  ROS: Cardiovascular:  no chest pain Respiratory: no dyspnea  The patient's Past Medical, Family and Social History were reviewed and are on file in the EMR.  Objective:  Med list reviewed  Current Outpatient Medications:    DULoxetine (CYMBALTA) 60 MG capsule, Take 120 mg by mouth daily., Disp: , Rfl:    etonogestrel (NEXPLANON) 68 MG IMPL implant, 1 each by Subdermal route once., Disp: , Rfl:    Multiple Vitamin (MULTIVITAMIN) tablet, Take 1 tablet by mouth daily., Disp: , Rfl:    nadolol (CORGARD) 20 MG tablet, TAKE 1 TABLET(20 MG) BY MOUTH DAILY, Disp: 90 tablet, Rfl: 3   OZEMPIC, 0.25 OR 0.5 MG/DOSE, 2 MG/1.5ML SOPN, Inject into the skin., Disp: , Rfl:    simvastatin (ZOCOR) 10 MG tablet, every evening., Disp: , Rfl:    Vital signs in last 24 hrs: Vitals:   08/09/21 1545  BP: 100/70  Pulse: (!) 108   Wt  Readings from Last 3 Encounters:  08/09/21 175 lb (79.4 kg)  06/02/20 190 lb (86.2 kg)  04/23/19 195 lb 2 oz (88.5 kg)    Physical Exam CMA leslie Wrenn present for entire visit Cardiac: RRR without murmurs, S1S2 heard, no peripheral edema Pulm: clear to auscultation bilaterally, normal RR and effort noted Abdomen: soft, no tenderness, with active bowel sounds. No guarding or palpable hepatosplenomegaly. Prominent but not inflamed or thrombosed external hemorrhoidal tissue. DRE normal without fissure or tenderness.  Firm stool in rectal vault. Anoscopy: Internal hemorrhoids  Labs:   ___________________________________________ Radiologic studies:   ____________________________________________ Other:   _____________________________________________ Assessment & Plan  Assessment: Encounter Diagnoses  Name Primary?   Rectal bleeding    Hepatic cirrhosis, unspecified hepatic cirrhosis type, unspecified whether ascites present (HCC) Yes   Varices, gastric    While she has internal hemorrhoids likely related to portal hypertension with known gastric varices, the intense pain she describes along with swelling before the bleeding occurs sounds more like thrombosed external hemorrhoidal episodes.  Rather than proceed to internal hemorrhoidal banding, I recommended she see Dr. Marcello Moores of [facility] for an opinion on internal hemorrhoidal banding versus surgical excision.   Nelida Meuse III

## 2021-08-09 NOTE — Patient Instructions (Addendum)
If you are age 45 or older, your body mass index should be between 23-30. Your Body mass index is 26.61 kg/m. If this is out of the aforementioned range listed, please consider follow up with your Primary Care Provider.  If you are age 2 or younger, your body mass index should be between 19-25. Your Body mass index is 26.61 kg/m. If this is out of the aformentioned range listed, please consider follow up with your Primary Care Provider.   ________________________________________________________  The [facility] providers would like to encourage you to use [facility] to communicate with providers for non-urgent requests or questions.  Due to long hold times on the telephone, sending your provider a message by [facility] may be a faster and more efficient way to get a response.  Please allow 48 business hours for a response.  Please remember that this is for non-urgent requests.  _______________________________________________________  We will send a referral to [facility]. Dr. Marcello Moores Make certain to bring a list of current medications, including any over the counter medications or vitamins. Also bring your co-pay if you have one as well as your insurance cards. [facility] is located at [address], [address]. Should you need to reschedule your appointment, please contact them at [phone].   It was a pleasure to see you today!  Thank you for trusting me with your gastrointestinal care!

## 2021-08-10 ENCOUNTER — Ambulatory Visit
Admission: RE | Admit: 2021-08-10 | Discharge: 2021-08-10 | Disposition: A | Discharge status: Home / Self Care | Payer: BC Managed Care – PPO | Source: Ambulatory Visit | Attending: Nurse Practitioner | Admitting: Nurse Practitioner

## 2021-08-10 DIAGNOSIS — K7689 Other specified diseases of liver: Secondary | ICD-10-CM | POA: Diagnosis not present

## 2021-08-10 DIAGNOSIS — K7469 Other cirrhosis of liver: Secondary | ICD-10-CM

## 2021-08-11 ENCOUNTER — Telehealth: Payer: Self-pay

## 2021-08-11 NOTE — Telephone Encounter (Signed)
Records sent to [facility]- Dr A. Thomas. Will await appointment.

## 2021-08-25 NOTE — Telephone Encounter (Signed)
Spoke with [place], scheduler at [employer]. She indicates that patient does not yet have an appointment. I have left a message for Lattie Haw, new patient coordinator to ask that patient be scheduled for an appointment for internal hemorrhoids.

## 2021-08-29 DIAGNOSIS — F418 Other specified anxiety disorders: Secondary | ICD-10-CM | POA: Diagnosis not present

## 2021-08-29 DIAGNOSIS — Z79899 Other long term (current) drug therapy: Secondary | ICD-10-CM | POA: Diagnosis not present

## 2021-08-29 DIAGNOSIS — F17210 Nicotine dependence, cigarettes, uncomplicated: Secondary | ICD-10-CM | POA: Diagnosis not present

## 2021-08-29 DIAGNOSIS — E1165 Type 2 diabetes mellitus with hyperglycemia: Secondary | ICD-10-CM | POA: Diagnosis not present

## 2021-09-02 NOTE — Telephone Encounter (Signed)
Patient has been scheduled for 09-26-2021 @ 3 pm with Dr. Joyice Faster

## 2021-09-07 DIAGNOSIS — Z3009 Encounter for other general counseling and advice on contraception: Secondary | ICD-10-CM | POA: Diagnosis not present

## 2021-09-15 DIAGNOSIS — Z3202 Encounter for pregnancy test, result negative: Secondary | ICD-10-CM | POA: Diagnosis not present

## 2021-09-15 DIAGNOSIS — Z3046 Encounter for surveillance of implantable subdermal contraceptive: Secondary | ICD-10-CM | POA: Diagnosis not present

## 2021-09-26 DIAGNOSIS — K649 Unspecified hemorrhoids: Secondary | ICD-10-CM | POA: Diagnosis not present

## 2021-11-15 DIAGNOSIS — Z0000 Encounter for general adult medical examination without abnormal findings: Secondary | ICD-10-CM | POA: Diagnosis not present

## 2021-11-15 DIAGNOSIS — E785 Hyperlipidemia, unspecified: Secondary | ICD-10-CM | POA: Diagnosis not present

## 2021-11-15 DIAGNOSIS — E1165 Type 2 diabetes mellitus with hyperglycemia: Secondary | ICD-10-CM | POA: Diagnosis not present

## 2021-11-17 DIAGNOSIS — M546 Pain in thoracic spine: Secondary | ICD-10-CM | POA: Diagnosis not present

## 2021-11-17 DIAGNOSIS — R293 Abnormal posture: Secondary | ICD-10-CM | POA: Diagnosis not present

## 2021-11-17 DIAGNOSIS — R519 Headache, unspecified: Secondary | ICD-10-CM | POA: Diagnosis not present

## 2021-11-17 DIAGNOSIS — M542 Cervicalgia: Secondary | ICD-10-CM | POA: Diagnosis not present

## 2021-11-22 DIAGNOSIS — M542 Cervicalgia: Secondary | ICD-10-CM | POA: Diagnosis not present

## 2021-11-22 DIAGNOSIS — R519 Headache, unspecified: Secondary | ICD-10-CM | POA: Diagnosis not present

## 2021-11-22 DIAGNOSIS — M546 Pain in thoracic spine: Secondary | ICD-10-CM | POA: Diagnosis not present

## 2021-11-22 DIAGNOSIS — R293 Abnormal posture: Secondary | ICD-10-CM | POA: Diagnosis not present

## 2021-11-24 DIAGNOSIS — M546 Pain in thoracic spine: Secondary | ICD-10-CM | POA: Diagnosis not present

## 2021-11-24 DIAGNOSIS — M542 Cervicalgia: Secondary | ICD-10-CM | POA: Diagnosis not present

## 2021-11-24 DIAGNOSIS — R293 Abnormal posture: Secondary | ICD-10-CM | POA: Diagnosis not present

## 2021-11-24 DIAGNOSIS — R519 Headache, unspecified: Secondary | ICD-10-CM | POA: Diagnosis not present

## 2021-11-28 ENCOUNTER — Ambulatory Visit: Payer: BC Managed Care – PPO | Admitting: Podiatry

## 2021-11-28 ENCOUNTER — Encounter: Payer: Self-pay | Admitting: Podiatry

## 2021-11-28 DIAGNOSIS — E119 Type 2 diabetes mellitus without complications: Secondary | ICD-10-CM

## 2021-11-28 DIAGNOSIS — L84 Corns and callosities: Secondary | ICD-10-CM | POA: Diagnosis not present

## 2021-11-28 NOTE — Progress Notes (Signed)
  Subjective:  Patient ID: Carmen Nielsen, female    DOB: 01-20-1976,   MRN: [id number]  Chief Complaint  Patient presents with   Callouses    Patient is a diabetic . Patient states that she has had this callus for three years.    46 y.o. female presents for concern of painful callus on left foot that has been present for three years and has been bothersome. Has tried trimming back  herself but was concerned because of diabetes.   Relates burning and tingling in their feet. Patient is diabetic and last A1c was unknown.   PCP:  Kathyrn Lass, MD    . Denies any other pedal complaints. Denies n/v/f/c.   Past Medical History:  Diagnosis Date   Anxiety    Cirrhosis ([place])    Cryptogenic cirrhosis (HCC)    Depression    DM (diabetes mellitus) ([place])    Elevated LFTs    GAD (generalized anxiety disorder)    GERD (gastroesophageal reflux disease)    Hepatic steatosis    Portal hypertensive gastropathy (HCC)    Psoriasis    Vitamin D deficiency     Objective:  Physical Exam: Vascular: DP/PT pulses 2/4 bilateral. CFT <3 seconds. Normal hair growth on digits. No edema.  Skin. No lacerations or abrasions bilateral feet. Hyperkeratotic lesion noted plantar left heel.  Musculoskeletal: MMT 5/5 bilateral lower extremities in DF, PF, Inversion and Eversion. Deceased ROM in DF of ankle joint.  Neurological: Sensation intact to light touch.   Assessment:   1. Type 2 diabetes mellitus without complication, unspecified whether long term insulin use (HCC)   2. Callus of foot      Plan:  Patient was evaluated and treated and all questions answered. -Discussed and educated patient on diabetic foot care, especially with  regards to the vascular, neurological and musculoskeletal systems.  -Stressed the importance of good glycemic control and the detriment of not  controlling glucose levels in relation to the foot. -Discussed supportive shoes at all times and checking feet regularly.   -Mechanically debrided hyperkeratotic tissue with chisel without incident as courtesy.  -Patient to return  in 1 year for diabetic foot check.  -Patient advised to call the office if any problems or questions arise in the meantime.   Lorenda Peck, DPM

## 2021-11-29 DIAGNOSIS — M546 Pain in thoracic spine: Secondary | ICD-10-CM | POA: Diagnosis not present

## 2021-11-29 DIAGNOSIS — R519 Headache, unspecified: Secondary | ICD-10-CM | POA: Diagnosis not present

## 2021-11-29 DIAGNOSIS — R293 Abnormal posture: Secondary | ICD-10-CM | POA: Diagnosis not present

## 2021-11-29 DIAGNOSIS — M542 Cervicalgia: Secondary | ICD-10-CM | POA: Diagnosis not present

## 2021-12-01 DIAGNOSIS — R519 Headache, unspecified: Secondary | ICD-10-CM | POA: Diagnosis not present

## 2021-12-01 DIAGNOSIS — R293 Abnormal posture: Secondary | ICD-10-CM | POA: Diagnosis not present

## 2021-12-01 DIAGNOSIS — M546 Pain in thoracic spine: Secondary | ICD-10-CM | POA: Diagnosis not present

## 2021-12-01 DIAGNOSIS — M542 Cervicalgia: Secondary | ICD-10-CM | POA: Diagnosis not present

## 2021-12-06 DIAGNOSIS — R293 Abnormal posture: Secondary | ICD-10-CM | POA: Diagnosis not present

## 2021-12-06 DIAGNOSIS — M542 Cervicalgia: Secondary | ICD-10-CM | POA: Diagnosis not present

## 2021-12-06 DIAGNOSIS — R519 Headache, unspecified: Secondary | ICD-10-CM | POA: Diagnosis not present

## 2021-12-06 DIAGNOSIS — M546 Pain in thoracic spine: Secondary | ICD-10-CM | POA: Diagnosis not present

## 2021-12-08 DIAGNOSIS — R519 Headache, unspecified: Secondary | ICD-10-CM | POA: Diagnosis not present

## 2021-12-08 DIAGNOSIS — M542 Cervicalgia: Secondary | ICD-10-CM | POA: Diagnosis not present

## 2021-12-08 DIAGNOSIS — M546 Pain in thoracic spine: Secondary | ICD-10-CM | POA: Diagnosis not present

## 2021-12-08 DIAGNOSIS — R293 Abnormal posture: Secondary | ICD-10-CM | POA: Diagnosis not present

## 2021-12-13 DIAGNOSIS — R293 Abnormal posture: Secondary | ICD-10-CM | POA: Diagnosis not present

## 2021-12-13 DIAGNOSIS — M542 Cervicalgia: Secondary | ICD-10-CM | POA: Diagnosis not present

## 2021-12-13 DIAGNOSIS — R519 Headache, unspecified: Secondary | ICD-10-CM | POA: Diagnosis not present

## 2021-12-13 DIAGNOSIS — M546 Pain in thoracic spine: Secondary | ICD-10-CM | POA: Diagnosis not present

## 2021-12-20 DIAGNOSIS — R519 Headache, unspecified: Secondary | ICD-10-CM | POA: Diagnosis not present

## 2021-12-20 DIAGNOSIS — M542 Cervicalgia: Secondary | ICD-10-CM | POA: Diagnosis not present

## 2021-12-20 DIAGNOSIS — M546 Pain in thoracic spine: Secondary | ICD-10-CM | POA: Diagnosis not present

## 2021-12-20 DIAGNOSIS — R293 Abnormal posture: Secondary | ICD-10-CM | POA: Diagnosis not present

## 2021-12-22 DIAGNOSIS — M542 Cervicalgia: Secondary | ICD-10-CM | POA: Diagnosis not present

## 2021-12-22 DIAGNOSIS — R293 Abnormal posture: Secondary | ICD-10-CM | POA: Diagnosis not present

## 2021-12-22 DIAGNOSIS — M546 Pain in thoracic spine: Secondary | ICD-10-CM | POA: Diagnosis not present

## 2021-12-22 DIAGNOSIS — R519 Headache, unspecified: Secondary | ICD-10-CM | POA: Diagnosis not present

## 2022-01-03 DIAGNOSIS — M546 Pain in thoracic spine: Secondary | ICD-10-CM | POA: Diagnosis not present

## 2022-01-03 DIAGNOSIS — R293 Abnormal posture: Secondary | ICD-10-CM | POA: Diagnosis not present

## 2022-01-03 DIAGNOSIS — R519 Headache, unspecified: Secondary | ICD-10-CM | POA: Diagnosis not present

## 2022-01-03 DIAGNOSIS — M542 Cervicalgia: Secondary | ICD-10-CM | POA: Diagnosis not present

## 2022-01-24 DIAGNOSIS — M5431 Sciatica, right side: Secondary | ICD-10-CM | POA: Diagnosis not present

## 2022-01-26 DIAGNOSIS — R202 Paresthesia of skin: Secondary | ICD-10-CM | POA: Diagnosis not present

## 2022-01-26 DIAGNOSIS — M5441 Lumbago with sciatica, right side: Secondary | ICD-10-CM | POA: Diagnosis not present

## 2022-01-26 DIAGNOSIS — M25551 Pain in right hip: Secondary | ICD-10-CM | POA: Diagnosis not present

## 2022-01-30 ENCOUNTER — Other Ambulatory Visit: Payer: Self-pay | Admitting: Family Medicine

## 2022-01-30 ENCOUNTER — Ambulatory Visit
Admission: RE | Admit: 2022-01-30 | Discharge: 2022-01-30 | Disposition: A | Discharge status: Home / Self Care | Payer: BC Managed Care – PPO | Source: Ambulatory Visit | Attending: Family Medicine | Admitting: Family Medicine

## 2022-01-30 DIAGNOSIS — R202 Paresthesia of skin: Secondary | ICD-10-CM | POA: Diagnosis not present

## 2022-01-30 DIAGNOSIS — M5441 Lumbago with sciatica, right side: Secondary | ICD-10-CM

## 2022-01-30 DIAGNOSIS — M25551 Pain in right hip: Secondary | ICD-10-CM | POA: Diagnosis not present

## 2022-01-30 DIAGNOSIS — M47816 Spondylosis without myelopathy or radiculopathy, lumbar region: Secondary | ICD-10-CM | POA: Diagnosis not present

## 2022-02-06 DIAGNOSIS — M5136 Other intervertebral disc degeneration, lumbar region: Secondary | ICD-10-CM | POA: Diagnosis not present

## 2022-02-06 DIAGNOSIS — Z6826 Body mass index (BMI) 26.0-26.9, adult: Secondary | ICD-10-CM | POA: Diagnosis not present

## 2022-02-06 DIAGNOSIS — M545 Low back pain, unspecified: Secondary | ICD-10-CM | POA: Diagnosis not present

## 2022-02-21 DIAGNOSIS — M5440 Lumbago with sciatica, unspecified side: Secondary | ICD-10-CM | POA: Diagnosis not present

## 2022-03-08 DIAGNOSIS — Z79899 Other long term (current) drug therapy: Secondary | ICD-10-CM | POA: Diagnosis not present

## 2022-03-08 DIAGNOSIS — M5440 Lumbago with sciatica, unspecified side: Secondary | ICD-10-CM | POA: Diagnosis not present

## 2022-03-08 DIAGNOSIS — E1165 Type 2 diabetes mellitus with hyperglycemia: Secondary | ICD-10-CM | POA: Diagnosis not present

## 2022-03-08 DIAGNOSIS — E785 Hyperlipidemia, unspecified: Secondary | ICD-10-CM | POA: Diagnosis not present

## 2022-03-08 DIAGNOSIS — D582 Other hemoglobinopathies: Secondary | ICD-10-CM | POA: Diagnosis not present

## 2022-03-08 DIAGNOSIS — M5126 Other intervertebral disc displacement, lumbar region: Secondary | ICD-10-CM | POA: Diagnosis not present

## 2022-03-09 DIAGNOSIS — M5440 Lumbago with sciatica, unspecified side: Secondary | ICD-10-CM | POA: Diagnosis not present

## 2022-03-10 DIAGNOSIS — R7309 Other abnormal glucose: Secondary | ICD-10-CM | POA: Diagnosis not present

## 2022-03-10 DIAGNOSIS — E1165 Type 2 diabetes mellitus with hyperglycemia: Secondary | ICD-10-CM | POA: Diagnosis not present

## 2022-03-10 DIAGNOSIS — Z79899 Other long term (current) drug therapy: Secondary | ICD-10-CM | POA: Diagnosis not present

## 2022-03-10 DIAGNOSIS — D582 Other hemoglobinopathies: Secondary | ICD-10-CM | POA: Diagnosis not present

## 2022-03-10 DIAGNOSIS — E785 Hyperlipidemia, unspecified: Secondary | ICD-10-CM | POA: Diagnosis not present

## 2022-05-11 ENCOUNTER — Other Ambulatory Visit: Payer: Self-pay | Admitting: Nurse Practitioner

## 2022-05-11 DIAGNOSIS — K7469 Other cirrhosis of liver: Secondary | ICD-10-CM | POA: Diagnosis not present

## 2022-05-11 DIAGNOSIS — I8510 Secondary esophageal varices without bleeding: Secondary | ICD-10-CM | POA: Diagnosis not present

## 2022-05-16 DIAGNOSIS — Z79899 Other long term (current) drug therapy: Secondary | ICD-10-CM | POA: Diagnosis not present

## 2022-05-16 DIAGNOSIS — E1165 Type 2 diabetes mellitus with hyperglycemia: Secondary | ICD-10-CM | POA: Diagnosis not present

## 2022-05-16 DIAGNOSIS — F17210 Nicotine dependence, cigarettes, uncomplicated: Secondary | ICD-10-CM | POA: Diagnosis not present

## 2022-05-16 DIAGNOSIS — D582 Other hemoglobinopathies: Secondary | ICD-10-CM | POA: Diagnosis not present

## 2022-05-16 DIAGNOSIS — E782 Mixed hyperlipidemia: Secondary | ICD-10-CM | POA: Diagnosis not present

## 2022-06-09 ENCOUNTER — Ambulatory Visit
Admission: RE | Admit: 2022-06-09 | Discharge: 2022-06-09 | Disposition: A | Discharge status: Home / Self Care | Payer: BC Managed Care – PPO | Source: Ambulatory Visit | Attending: Nurse Practitioner | Admitting: Nurse Practitioner

## 2022-06-09 DIAGNOSIS — K7469 Other cirrhosis of liver: Secondary | ICD-10-CM

## 2022-06-09 DIAGNOSIS — K7460 Unspecified cirrhosis of liver: Secondary | ICD-10-CM | POA: Diagnosis not present

## 2022-06-28 ENCOUNTER — Other Ambulatory Visit: Payer: Self-pay | Admitting: Family Medicine

## 2022-06-28 DIAGNOSIS — Z1231 Encounter for screening mammogram for malignant neoplasm of breast: Secondary | ICD-10-CM

## 2022-06-29 ENCOUNTER — Ambulatory Visit
Admission: RE | Admit: 2022-06-29 | Discharge: 2022-06-29 | Disposition: A | Discharge status: Home / Self Care | Payer: BC Managed Care – PPO | Source: Ambulatory Visit | Attending: Family Medicine | Admitting: Family Medicine

## 2022-06-29 DIAGNOSIS — Z1231 Encounter for screening mammogram for malignant neoplasm of breast: Secondary | ICD-10-CM | POA: Diagnosis not present

## 2022-11-17 ENCOUNTER — Other Ambulatory Visit: Payer: Self-pay | Admitting: Nurse Practitioner

## 2022-11-17 DIAGNOSIS — K7469 Other cirrhosis of liver: Secondary | ICD-10-CM | POA: Diagnosis not present

## 2022-11-17 DIAGNOSIS — R945 Abnormal results of liver function studies: Secondary | ICD-10-CM | POA: Diagnosis not present

## 2022-11-17 DIAGNOSIS — I8510 Secondary esophageal varices without bleeding: Secondary | ICD-10-CM | POA: Diagnosis not present

## 2022-11-17 DIAGNOSIS — R772 Abnormality of alphafetoprotein: Secondary | ICD-10-CM | POA: Diagnosis not present

## 2022-11-17 DIAGNOSIS — R791 Abnormal coagulation profile: Secondary | ICD-10-CM | POA: Diagnosis not present

## 2022-11-23 DIAGNOSIS — Z0000 Encounter for general adult medical examination without abnormal findings: Secondary | ICD-10-CM | POA: Diagnosis not present

## 2022-11-23 DIAGNOSIS — E1165 Type 2 diabetes mellitus with hyperglycemia: Secondary | ICD-10-CM | POA: Diagnosis not present

## 2022-11-23 DIAGNOSIS — E782 Mixed hyperlipidemia: Secondary | ICD-10-CM | POA: Diagnosis not present

## 2022-11-23 DIAGNOSIS — F17210 Nicotine dependence, cigarettes, uncomplicated: Secondary | ICD-10-CM | POA: Diagnosis not present

## 2022-11-23 DIAGNOSIS — K7469 Other cirrhosis of liver: Secondary | ICD-10-CM | POA: Diagnosis not present

## 2022-11-27 ENCOUNTER — Ambulatory Visit: Payer: BC Managed Care – PPO | Admitting: Podiatry

## 2022-12-01 ENCOUNTER — Ambulatory Visit
Admission: RE | Admit: 2022-12-01 | Discharge: 2022-12-01 | Disposition: A | Discharge status: Home / Self Care | Payer: BC Managed Care – PPO | Source: Ambulatory Visit | Attending: Nurse Practitioner | Admitting: Nurse Practitioner

## 2022-12-01 DIAGNOSIS — I8510 Secondary esophageal varices without bleeding: Secondary | ICD-10-CM

## 2022-12-01 DIAGNOSIS — K7460 Unspecified cirrhosis of liver: Secondary | ICD-10-CM | POA: Diagnosis not present

## 2022-12-01 DIAGNOSIS — K7469 Other cirrhosis of liver: Secondary | ICD-10-CM

## 2022-12-04 ENCOUNTER — Encounter: Payer: Self-pay | Admitting: Gastroenterology

## 2023-01-04 ENCOUNTER — Encounter: Payer: Self-pay | Admitting: Gastroenterology

## 2023-01-04 ENCOUNTER — Ambulatory Visit: Payer: BC Managed Care – PPO

## 2023-01-04 VITALS — Ht 68.0 in | Wt 160.0 lb

## 2023-01-04 DIAGNOSIS — Z1211 Encounter for screening for malignant neoplasm of colon: Secondary | ICD-10-CM

## 2023-01-04 MED ORDER — NA SULFATE-K SULFATE-MG SULF 17.5-3.13-1.6 GM/177ML PO SOLN: 1.0000 | Freq: Once | ORAL | 0 refills | Status: AC

## 2023-01-04 NOTE — Progress Notes (Signed)
Pre visit completed via phone call; Patient verified name, DOB, and address; No egg or soy allergy known to patient;  No issues known to pt with past sedation with any surgeries or procedures; Patient denies ever being told they had issues or difficulty with intubation;  No FH of Malignant Hyperthermia; Pt is not on diet pills; Pt is not on home 02;  Pt is not on blood thinners;  Pt reports issues with constipation - patient reports she is increasing her fluids, activity (as tolerated), and taking stool softener and /or laxative if needed; also increasing fruits/veggies as able except for thaose that are not allowed;  No A fib or A flutter; Have any cardiac testing pending--NO Insurance verified during PV appt--- BCBS PPO Pt can ambulate without assistance;  Pt denies use of chewing tobacco; Discussed diabetic/weight loss medication holds; Discussed NSAID holds; Checked BMI to be less than 50; Pt instructed to use Single[employer] or GoodRx for a price reduction on prep;  Patient's chart reviewed by Cathlyn Parsons CNRA prior to previsit and patient appropriate for the [facility];  Pre visit completed and red dot placed by patient's name on their procedure day (on provider's schedule);    Instructions sent to MyChart per patient request;

## 2023-01-14 ENCOUNTER — Encounter: Payer: Self-pay | Admitting: Certified Registered Nurse Anesthetist

## 2023-01-18 ENCOUNTER — Encounter: Payer: Self-pay | Admitting: Gastroenterology

## 2023-01-18 ENCOUNTER — Ambulatory Visit (AMBULATORY_SURGERY_CENTER): Payer: BC Managed Care – PPO | Admitting: Gastroenterology

## 2023-01-18 VITALS — BP 100/62 | HR 88 | Temp 97.5°F | Resp 14 | Ht 68.0 in | Wt 160.0 lb

## 2023-01-18 DIAGNOSIS — Z1211 Encounter for screening for malignant neoplasm of colon: Secondary | ICD-10-CM | POA: Diagnosis not present

## 2023-01-18 MED ORDER — SODIUM CHLORIDE 0.9 % IV SOLN: 500.0000 mL | Freq: Once | INTRAVENOUS | Status: DC

## 2023-01-18 NOTE — Patient Instructions (Addendum)
-  Handout on hemorrhoids provided -repeat colonoscopy in 10 years for surveillance recommended.  -Continue present medications   YOU HAD AN ENDOSCOPIC PROCEDURE TODAY AT THE [facility]:   Refer to the procedure report that was given to you for any specific questions about what was found during the examination.  If the procedure report does not answer your questions, please call your gastroenterologist to clarify.  If you requested that your care partner not be given the details of your procedure findings, then the procedure report has been included in a sealed envelope for you to review at your convenience later.  YOU SHOULD EXPECT: Some feelings of bloating in the abdomen. Passage of more gas than usual.  Walking can help get rid of the air that was put into your GI tract during the procedure and reduce the bloating. If you had a lower endoscopy (such as a colonoscopy or flexible sigmoidoscopy) you may notice spotting of blood in your stool or on the toilet paper. If you underwent a bowel prep for your procedure, you may not have a normal bowel movement for a few days.  Please Note:  You might notice some irritation and congestion in your nose or some drainage.  This is from the oxygen used during your procedure.  There is no need for concern and it should clear up in a day or so.  SYMPTOMS TO REPORT IMMEDIATELY:  Following lower endoscopy (colonoscopy or flexible sigmoidoscopy):  Excessive amounts of blood in the stool  Significant tenderness or worsening of abdominal pains  Swelling of the abdomen that is new, acute  Fever of 100F or higher For urgent or emergent issues, a gastroenterologist can be reached at any hour by calling [phone]. Do not use MyChart messaging for urgent concerns.    DIET:  We do recommend a small meal at first, but then you may proceed to your regular diet.  Drink plenty of fluids but you should avoid alcoholic beverages for 24 hours.  ACTIVITY:   You should plan to take it easy for the rest of today and you should NOT DRIVE or use heavy machinery until tomorrow (because of the sedation medicines used during the test).    FOLLOW UP: Our staff will call the number listed on your records the next business day following your procedure.  We will call around 7:15- 8:00 am to check on you and address any questions or concerns that you may have regarding the information given to you following your procedure. If we do not reach you, we will leave a message.     If any biopsies were taken you will be contacted by phone or by letter within the next 1-3 weeks.  Please call us at [phone] if you have not heard about the biopsies in 3 weeks.    SIGNATURES/CONFIDENTIALITY: You and/or your care partner have signed paperwork which will be entered into your electronic medical record.  These signatures attest to the fact that that the information above on your After Visit Summary has been reviewed and is understood.  Full responsibility of the confidentiality of this discharge information lies with you and/or your care-partner.

## 2023-01-18 NOTE — Op Note (Signed)
[facility] Patient Name: Carmen Nielsen Procedure Date: 01/18/2023 8:33 AM MRN: [id number] Endoscopist: Sherilyn Cooter L. Myrtie Neither , MD, [id number] Age: 47 Referring MD:  Date of Birth: 10-Oct-1975 Gender: Female Account #: [id number] Procedure:                Colonoscopy Indications:              Screening for colorectal malignant neoplasm, This                            is the patient's first colonoscopy Medicines:                Monitored Anesthesia Care Procedure:                Pre-Anesthesia Assessment:                           - Prior to the procedure, a History and Physical                            was performed, and patient medications and                            allergies were reviewed. The patient's tolerance of                            previous anesthesia was also reviewed. The risks                            and benefits of the procedure and the sedation                            options and risks were discussed with the patient.                            All questions were answered, and informed consent                            was obtained. Prior Anticoagulants: The patient has                            taken no anticoagulant or antiplatelet agents. ASA                            Grade Assessment: III - A patient with severe                            systemic disease. After reviewing the risks and                            benefits, the patient was deemed in satisfactory                            condition to undergo the procedure.  After obtaining informed consent, the colonoscope                            was passed under direct vision. Throughout the                            procedure, the patient's blood pressure, pulse, and                            oxygen saturations were monitored continuously. The                            CF [id number] #[id number] was introduced through the anus                            and advanced to  the the cecum, identified by                            appendiceal orifice and ileocecal valve. The                            colonoscopy was performed without difficulty. The                            patient tolerated the procedure well. The quality                            of the bowel preparation was good. The ileocecal                            valve, appendiceal orifice, and rectum were                            photographed. Scope In: 8:46:07 AM Scope Out: 8:58:42 AM Scope Withdrawal Time: 0 hours 9 minutes 2 seconds  Total Procedure Duration: 0 hours 12 minutes 35 seconds  Findings:                 Hemorrhoids were found on perianal exam. (Swollen                            internal hemorrhoids prolapsed and contiguous with                            swollen external hemorrhoids)                           Repeat examination of right colon under NBI                            performed.                           Internal hemorrhoids were found. The hemorrhoids  were large and Grade II (internal hemorrhoids that                            prolapse but reduce spontaneously).                           The exam was otherwise without abnormality on                            direct and retroflexion views. Specifically, no                            rectal varices were seen. Complications:            No immediate complications. Estimated Blood Loss:     Estimated blood loss: none. Impression:               - Hemorrhoids found on perianal exam.                           - Internal hemorrhoids.                           - The examination was otherwise normal on direct                            and retroflexion views.                           - No specimens collected. Recommendation:           - Patient has a contact number available for                            emergencies. The signs and symptoms of potential                            delayed  complications were discussed with the                            patient. Return to normal activities tomorrow.                            Written discharge instructions were provided to the                            patient.                           - Resume previous diet.                           - Continue present medications.                           - Repeat colonoscopy in 10 years for screening  purposes.                           - Report wil be forwarded to Dr. Maisie Fus of                            colorectal surgery for reconsideration of surgical                            therapy for intermittently symptomatic internal                            hemorrhoids (swelling and bleeding). There was                            understandable concern on surgeries part regarding                            his interventions when seen for office consultation                            because of patient's underlying cirrhosis.                            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 Itsel Opfer L. Myrtie Neither, MD 01/18/2023 9:07:47 AM This report has been signed electronically.

## 2023-01-18 NOTE — Progress Notes (Signed)
History and Physical:  This patient presents for endoscopic testing for: Encounter Diagnosis  Name Primary?   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  Follows with [facility] for this, MELD score 8. Here today for her for screening colonoscopy at age 73.  I last saw her in the office May 2023 for episodic internal hemorrhoidal swelling and bleeding.  She was then seen by Dr. Maisie Fus of colorectal surgery who felt that these were likely rectal varices from her cirrhosis. Today Carmen Nielsen reports that she continues to have episodic swelling and bleeding in this area including overnight with a bowel preparation.  Patient is otherwise without complaints or active issues today.   Past Medical History: Past Medical History:  Diagnosis Date   Anxiety    Cirrhosis (HCC)    Cryptogenic cirrhosis (HCC)    Depression    DM (diabetes mellitus) (HCC)    Elevated LFTs    GAD (generalized anxiety disorder)    GERD (gastroesophageal reflux disease)    Hepatic steatosis    Portal hypertensive gastropathy (HCC)    Psoriasis    Vitamin D deficiency      Past Surgical History: Past Surgical History:  Procedure Laterality Date   LIVER BIOPSY     x 2   UPPER GASTROINTESTINAL ENDOSCOPY     WISDOM TOOTH EXTRACTION  1999    Allergies: Allergies  Allergen Reactions   Shellfish Allergy Nausea And Vomiting    Outpatient Meds: Current Outpatient Medications  Medication Sig Dispense Refill   atorvastatin (LIPITOR) 40 MG tablet Take 40 mg by mouth daily.     carvedilol (COREG) 3.125 MG tablet Take 3.125 mg by mouth 2 (two) times daily with a meal.     Docusate Calcium (STOOL SOFTENER PO) Take 1 tablet by mouth 2 (two) times a week.     DULoxetine (CYMBALTA) 60 MG capsule Take 60 mg by mouth daily.     etonogestrel (NEXPLANON) 68 MG IMPL implant 1 each by Subdermal route once.     Multiple Vitamin  (MULTIVITAMIN) tablet Take 1 tablet by mouth daily.     nadolol (CORGARD) 20 MG tablet TAKE 1 TABLET(20 MG) BY MOUTH DAILY 90 tablet 3   FIBER COMPLETE PO Take 1 Dose by mouth daily at 6 (six) AM. (Patient not taking: Reported on 01/18/2023)     OZEMPIC, 2 MG/DOSE, 8 MG/3ML SOPN Inject 1 Dose into the skin once a week.     tacrolimus (PROTOPIC) 0.1 % ointment Apply 1 Application topically 2 (two) times daily. (Patient not taking: Reported on 01/18/2023)     Current Facility-Administered Medications  Medication Dose Route Frequency Provider Last Rate Last Admin   0.9 %  sodium chloride infusion  500 mL Intravenous Once Danis, Starr Lake III, MD          ___________________________________________________________________ Objective   Exam:  BP 116/80   Pulse 86   Temp (!) 97.5 F (36.4 C) (Skin)   Resp 13   Ht 5\' 8"  (1.727 m)   Wt 160 lb (72.6 kg)   SpO2 100%   BMI 24.33 kg/m   CV: regular , S1/S2 Resp: clear to auscultation bilaterally, normal RR and effort noted GI: soft, no tenderness, with active bowel sounds.   Assessment: Encounter Diagnosis  Name Primary?   Colon cancer screening Yes     Plan: Colonoscopy   The benefits and risks of the planned  procedure were described in detail with the patient or (when appropriate) their health care proxy.  Risks were outlined as including, but not limited to, bleeding, infection, perforation, adverse medication reaction leading to cardiac or pulmonary decompensation, pancreatitis (if ERCP).  The limitation of incomplete mucosal visualization was also discussed.  No guarantees or warranties were given.  The patient is appropriate for an endoscopic procedure in the ambulatory setting.   - Amada Jupiter, MD

## 2023-01-18 NOTE — Progress Notes (Signed)
0853 HR > 100 with esmolol 25 mg given IV, MD updated, vss

## 2023-01-18 NOTE — Progress Notes (Signed)
Pt's states no medical or surgical changes since previsit or office visit. 

## 2023-01-18 NOTE — Progress Notes (Signed)
Report given to PACU, vss 

## 2023-01-19 ENCOUNTER — Telehealth: Payer: Self-pay | Admitting: *Deleted

## 2023-01-19 NOTE — Telephone Encounter (Signed)
No answer on  follow up call. Left message.   

## 2023-01-31 ENCOUNTER — Other Ambulatory Visit: Payer: Self-pay

## 2023-01-31 DIAGNOSIS — I864 Gastric varices: Secondary | ICD-10-CM

## 2023-01-31 DIAGNOSIS — K649 Unspecified hemorrhoids: Secondary | ICD-10-CM

## 2023-01-31 DIAGNOSIS — K7460 Unspecified cirrhosis of liver: Secondary | ICD-10-CM

## 2023-01-31 NOTE — Telephone Encounter (Signed)
Bonita Quin,    In addition to the referral, please order a CT Angiogram of the abdomen and pelvis (specify BRTO protocol) for evaluation of cirrhosis, gastric varices, rectal varices, internal hemorrhoids.   Let Danell know that is happening as well.  Getting it done before the consult with Dr Elby Showers of IR will help expedite things.  Thanks   - H. Danis

## 2023-02-19 ENCOUNTER — Other Ambulatory Visit: Payer: BC Managed Care – PPO

## 2023-02-22 ENCOUNTER — Ambulatory Visit
Admission: RE | Admit: 2023-02-22 | Discharge: 2023-02-22 | Disposition: A | Discharge status: Home / Self Care | Payer: BC Managed Care – PPO | Source: Ambulatory Visit | Attending: Gastroenterology | Admitting: Gastroenterology

## 2023-02-22 DIAGNOSIS — K7469 Other cirrhosis of liver: Secondary | ICD-10-CM | POA: Diagnosis not present

## 2023-02-22 DIAGNOSIS — K7460 Unspecified cirrhosis of liver: Secondary | ICD-10-CM

## 2023-02-22 DIAGNOSIS — K649 Unspecified hemorrhoids: Secondary | ICD-10-CM

## 2023-02-22 DIAGNOSIS — I864 Gastric varices: Secondary | ICD-10-CM

## 2023-02-22 MED ORDER — IOPAMIDOL (ISOVUE-370) INJECTION 76%
200.0000 mL | Freq: Once | INTRAVENOUS | Status: AC | PRN
  Administered 2023-02-22: 100 mL via INTRAVENOUS

## 2023-02-23 NOTE — Progress Notes (Signed)
Chief Complaint: Patient was seen in consultation today for cirrhosis with varices and hemorrhoids.  Referring Physician(s): Danis,Henry L III  History of Present Illness: Carmen Nielsen is a 47 y.o. 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 She does not have a history of ascites or hepatic encephalopathy.   She is followed by [facility] and is also followed locally by Dr. Myrtie Neither. Dr. Myrtie Neither referred the patient to the Surgery team July 2023 for evaluation of bleeding internal hemorrhoids with significant perirectal swelling and pressure. It was determined that her symptoms were consistent with her variceal disease and thus not amenable to hemorrhoid banding or hemorrhoidectomy. It was recommended that she be evaluated by Interventional Radiology for embolization and the patient requested time to investigate this treatment option.   The patient last followed up with Dr. Myrtie Neither October 2024 for her screening colonoscopy. She reported continued issues with episodic rectal swelling and bleeding. Her colonoscopy showed swollen internal hemorrhoids prolapsed and contiguous with swollen external hemorrhoids. The exam was otherwise normal. Dr. Myrtie Neither has referred the patient to Interventional Radiology for possible treatment/management options including TIPS/BRTO and variceal embolization.   Past Medical History:  Diagnosis Date   Anxiety    Cirrhosis (HCC)    Cryptogenic cirrhosis (HCC)    Depression    DM (diabetes mellitus) (HCC)    Elevated LFTs    GAD (generalized anxiety disorder)    GERD (gastroesophageal reflux disease)    Hepatic steatosis    Portal hypertensive gastropathy (HCC)    Psoriasis    Vitamin D deficiency     Past Surgical History:  Procedure Laterality Date   LIVER BIOPSY     x 2   UPPER  GASTROINTESTINAL ENDOSCOPY     WISDOM TOOTH EXTRACTION  1999    Allergies: Shellfish allergy  Medications: Prior to Admission medications   Medication Sig Start Date End Date Taking? Authorizing Provider  atorvastatin (LIPITOR) 40 MG tablet Take 40 mg by mouth daily. 03/04/21   [provider]  carvedilol (COREG) 3.125 MG tablet Take 3.125 mg by mouth 2 (two) times daily with a meal. 11/17/22 11/17/23  [provider]  Docusate Calcium (STOOL SOFTENER PO) Take 1 tablet by mouth 2 (two) times a week.    [provider]  DULoxetine (CYMBALTA) 60 MG capsule Take 60 mg by mouth daily.    [provider]  etonogestrel (NEXPLANON) 68 MG IMPL implant 1 each by Subdermal route once.    [provider]  FIBER COMPLETE PO Take 1 Dose by mouth daily at 6 (six) AM. Patient not taking: Reported on 01/18/2023    [provider]  Multiple Vitamin (MULTIVITAMIN) tablet Take 1 tablet by mouth daily.    [provider]  nadolol (CORGARD) 20 MG tablet TAKE 1 TABLET(20 MG) BY MOUTH DAILY 01/05/21   Danis, Starr Lake III, MD  OZEMPIC, 2 MG/DOSE, 8 MG/3ML SOPN Inject 1 Dose into the skin once a week.    [provider]  tacrolimus (PROTOPIC) 0.1 % ointment Apply 1 Application topically 2 (two) times daily. Patient not taking: Reported on 01/18/2023 05/16/22   [provider]     Family History  Problem Relation Age of Onset   Bell's palsy Father    Colon polyps Father    Esophageal cancer Maternal Grandfather 90   Diabetes Maternal Grandfather    Diabetes Paternal Grandmother  Colon cancer Neg Hx    Stomach cancer Neg Hx    Liver disease Neg Hx    Rectal cancer Neg Hx     Social History   Socioeconomic History   Marital status: Single    Spouse name: Not on file   Number of children: 0   Years of education: Not on file   Highest education level: Not on file  Occupational History   Occupation: Clerical  Tobacco Use    Smoking status: Every Day    Current packs/day: 0.50    Types: Cigarettes   Smokeless tobacco: Never  Vaping Use   Vaping status: Former  Substance and Sexual Activity   Alcohol use: No   Drug use: No   Sexual activity: Not on file  Other Topics Concern   Not on file  Social History Narrative   Not on file   Social Determinants of Health   Financial Resource Strain: Not on file  Food Insecurity: Low Risk  (05/11/2022)   [facility]   Hunger Vital Sign    Worried About Running Out of Food in the Last Year: Never true    Ran Out of Food in the Last Year: Never true  Transportation Needs: Not on file (05/11/2022)  Physical Activity: Not on file  Stress: Not on file  Social Connections: Not on file    Review of Systems: A 12 point ROS discussed and pertinent positives are indicated in the HPI above.  All other systems are negative.  Review of Systems  Vital Signs: There were no vitals taken for this visit.  Physical Exam  Imaging: CT ANGIO ABD/PELVIS BRTO  Result Date: 02/22/2023 CLINICAL DATA:  Cryptogenic cirrhosis. Evaluate for gastric and rectal varices. EXAM: CTA ABDOMEN AND PELVIS WITHOUT AND WITH CONTRAST TECHNIQUE: Multidetector CT imaging of the abdomen and pelvis was performed using the standard protocol during bolus administration of intravenous contrast. Multiplanar reconstructed images and MIPs were obtained and reviewed to evaluate the vascular anatomy. RADIATION DOSE REDUCTION: This exam was performed according to the departmental dose-optimization program which includes automated exposure control, adjustment of the mA and/or kV according to patient size and/or use of iterative reconstruction technique. CONTRAST:  ISOVUE-370 IOPAMIDOL (ISOVUE-370) INJECTION 76% COMPARISON:  CT abdomen pelvis-10/23/2013 Right upper quadrant abdominal ultrasound-12/01/2022 FINDINGS: VASCULAR Aorta: Scattered atherosclerotic plaque within a normal  caliber abdominal aorta, not resulting in a hemodynamically significant stenosis. No evidence of abdominal aortic dissection or perivascular stranding on this nongated examination. Celiac: Widely patent without hemodynamically significant narrowing. Conventional branching pattern. SMA: Widely patent without hemodynamically significant narrowing. Conventional branching pattern. The distal tributaries of the SMA appear widely patent without discrete lumen filling defect to suggest distal embolism. Renals: Solitary bilaterally; widely patent without hemodynamically significant narrowing. No vessel irregularity to suggest FMD. IMA: Widely patent without hemodynamically significant narrowing. Inflow: There is a minimal amount of mixed calcified and noncalcified atherosclerotic plaque involving the bilateral common iliac arteries, not resulting in a hemodynamically significant stenosis. The bilateral internal iliac arteries are diseased though patent and of normal caliber. The bilateral external iliac arteries are of normal caliber and widely patent without a hemodynamically significant narrowing. Proximal Outflow: The bilateral common and imaged portions of the bilateral deep and superficial femoral arteries are of normal caliber and widely patent without a hemodynamically significant narrowing. Veins: The IVC and pelvic venous systems appear widely patent. The portal and hepatic venous system are widely patent. There are several hypertrophied gastric varices  which extend into the gastric lumen at the level of gastric fundus (image 17, series 6), these findings are associated with a mildly hypertrophied splenorenal shunt. The splenic vein remains widely patent. There is no CT evidence of significantly hypertrophied esophageal or rectal varices. Review of the MIP images confirms the above findings. _________________________________________________________ NON-VASCULAR Lower chest: Limited visualization of the lower thorax  demonstrates minimal dependent subpleural ground-glass atelectasis, right-greater-than-left. No discrete focal airspace opacities. No pleural effusion or pneumothorax. Normal heart size.  No pericardial effusion. Hepatobiliary: Very mild nodularity of the hepatic contour. No discrete hepatic lesions. No intra or extrahepatic biliary ductal dilatation. No ascites. Pancreas: Normal appearance of the pancreas. Spleen: Normal appearance of the spleen. No evidence of splenomegaly. Adrenals/Urinary Tract: Review of the precontrast images is negative for the presence of any renal stones. There is symmetric enhancement of the bilateral kidneys. No discrete renal lesions. No urinary obstruction or perinephric stranding. Normal appearance of the bilateral adrenal glands. Normal appearance of the urinary bladder given degree of distention. Stomach/Bowel: Hypertrophied gastric varices extend to involve the gastric fundus (image 17, series 6). No intraluminal contrast extravasation to suggest acute variceal bleeding. There are no significantly hypertrophied rectal varices. Moderate colonic stool burden without evidence of enteric obstruction. Normal appearance of the terminal ileum. The appendix is not visualized, however there is no pericecal inflammatory change. No pneumoperitoneum, pneumatosis or portal venous gas. Lymphatic: No bulky retroperitoneal, mesenteric, pelvic or inguinal lymphadenopathy. Reproductive: Normal appearance of the pelvic organs. Note is made of a 2.4 x 1.6 cm right-sided presumably physiologic adnexal cyst (image 81, series 16). Other: Tiny mesenteric fat containing periumbilical hernia. Musculoskeletal: No acute or aggressive osseous abnormalities. Moderate DDD of L5-S1 and S1-S2 with disc space height loss, endplate irregularity and sclerosis IMPRESSION: Vascular Impression: 1. Hypertrophied gastric varices with intraluminal extension supplied via a mildly hypertrophied splenorenal shunt. No  intraluminal contrast extravasation to suggest acute variceal bleeding however these varices are new compared to remote abdominal CT performed in 2015. Further evaluation with endoscopy could be performed as indicated. Ultimately, this patient could be considered for elective/prophylactic BRT0 as indicated. 2. No CT evidence of significantly hypertrophied esophageal or rectal varices. 3. Scattered atherosclerotic plaque within a normal caliber abdominal aorta, not resulting in a hemodynamically significant stenosis. Aortic Atherosclerosis (ICD10-I70.0). Nonvascular Impression: 1. Mild nodularity of the hepatic contour, compatible with provided history of cryptogenic cirrhosis. No discrete hepatic lesions though further evaluation with abdominal MRI could be performed as indicated. Electronically Signed   By: Simonne Come M.D.   On: 02/22/2023 12:06    Labs:  CBC: No results for input(s): "WBC", "HGB", "HCT", "PLT" in the last 8760 hours.  COAGS: No results for input(s): "INR", "APTT" in the last 8760 hours.  BMP: No results for input(s): "NA", "K", "CL", "CO2", "GLUCOSE", "BUN", "CALCIUM", "CREATININE", "GFRNONAA", "GFRAA" in the last 8760 hours.  Invalid input(s): "CMP"  LIVER FUNCTION TESTS: No results for input(s): "BILITOT", "AST", "ALT", "ALKPHOS", "PROT", "ALBUMIN" in the last 8760 hours.  TUMOR MARKERS: No results for input(s): "AFPTM", "CEA", "CA199", "CHROMGRNA" in the last 8760 hours.  Assessment and Plan:  47 year old female with a history of cirrhosis with portal hypertension, gastric varices and hemorrhoids.   Thank you for this interesting consult.  I greatly enjoyed meeting Carmen Nielsen and look forward to participating in their care.  A copy of this report was sent to the requesting provider on this date.  Electronically Signed: Mickie Kay, NP 02/23/2023, 11:31 AM  I spent a total of  40 Minutes   in face to face in clinical consultation, greater than 50%  of which was counseling/coordinating care for cirrhosis with portal hypertension.

## 2023-02-26 ENCOUNTER — Inpatient Hospital Stay
Admission: RE | Admit: 2023-02-26 | Discharge: 2023-02-26 | Disposition: A | Discharge status: Home / Self Care | Payer: BC Managed Care – PPO | Source: Ambulatory Visit | Attending: Gastroenterology

## 2023-02-26 DIAGNOSIS — K649 Unspecified hemorrhoids: Secondary | ICD-10-CM | POA: Diagnosis not present

## 2023-02-26 DIAGNOSIS — I864 Gastric varices: Secondary | ICD-10-CM | POA: Diagnosis not present

## 2023-02-26 DIAGNOSIS — K766 Portal hypertension: Secondary | ICD-10-CM | POA: Diagnosis not present

## 2023-02-26 DIAGNOSIS — K7469 Other cirrhosis of liver: Secondary | ICD-10-CM | POA: Diagnosis not present

## 2023-02-26 DIAGNOSIS — K7460 Unspecified cirrhosis of liver: Secondary | ICD-10-CM

## 2023-02-26 DIAGNOSIS — K648 Other hemorrhoids: Secondary | ICD-10-CM | POA: Diagnosis not present

## 2023-02-26 HISTORY — PX: IR RADIOLOGIST EVAL & MGMT: IMG5224

## 2023-03-07 ENCOUNTER — Encounter: Payer: Self-pay | Admitting: Interventional Radiology

## 2023-03-07 DIAGNOSIS — I864 Gastric varices: Secondary | ICD-10-CM

## 2023-03-29 ENCOUNTER — Other Ambulatory Visit (HOSPITAL_COMMUNITY): Payer: Self-pay | Admitting: Interventional Radiology

## 2023-03-29 DIAGNOSIS — I864 Gastric varices: Secondary | ICD-10-CM

## 2023-04-17 ENCOUNTER — Other Ambulatory Visit (HOSPITAL_COMMUNITY): Payer: Self-pay | Admitting: Student

## 2023-04-17 DIAGNOSIS — K7469 Other cirrhosis of liver: Secondary | ICD-10-CM

## 2023-04-18 ENCOUNTER — Ambulatory Visit (HOSPITAL_COMMUNITY)
Admission: RE | Admit: 2023-04-18 | Discharge: 2023-04-18 | Disposition: A | Discharge status: Home / Self Care | Payer: BC Managed Care – PPO | Source: Ambulatory Visit | Attending: Family Medicine | Admitting: Family Medicine

## 2023-04-18 ENCOUNTER — Other Ambulatory Visit (HOSPITAL_COMMUNITY): Payer: Self-pay | Admitting: Interventional Radiology

## 2023-04-18 ENCOUNTER — Other Ambulatory Visit: Payer: Self-pay

## 2023-04-18 VITALS — BP 99/70 | HR 87 | Temp 97.4°F | Resp 17 | Ht 68.0 in | Wt 160.0 lb

## 2023-04-18 DIAGNOSIS — F17210 Nicotine dependence, cigarettes, uncomplicated: Secondary | ICD-10-CM | POA: Insufficient documentation

## 2023-04-18 DIAGNOSIS — I864 Gastric varices: Secondary | ICD-10-CM | POA: Diagnosis not present

## 2023-04-18 DIAGNOSIS — Z79899 Other long term (current) drug therapy: Secondary | ICD-10-CM | POA: Diagnosis not present

## 2023-04-18 DIAGNOSIS — K766 Portal hypertension: Secondary | ICD-10-CM | POA: Diagnosis not present

## 2023-04-18 DIAGNOSIS — F32A Depression, unspecified: Secondary | ICD-10-CM | POA: Insufficient documentation

## 2023-04-18 DIAGNOSIS — Z7985 Long-term (current) use of injectable non-insulin antidiabetic drugs: Secondary | ICD-10-CM | POA: Diagnosis not present

## 2023-04-18 DIAGNOSIS — E119 Type 2 diabetes mellitus without complications: Secondary | ICD-10-CM | POA: Diagnosis not present

## 2023-04-18 DIAGNOSIS — K648 Other hemorrhoids: Secondary | ICD-10-CM | POA: Insufficient documentation

## 2023-04-18 DIAGNOSIS — K7469 Other cirrhosis of liver: Secondary | ICD-10-CM | POA: Diagnosis not present

## 2023-04-18 DIAGNOSIS — K759 Inflammatory liver disease, unspecified: Secondary | ICD-10-CM | POA: Diagnosis not present

## 2023-04-18 DIAGNOSIS — K7400 Hepatic fibrosis, unspecified: Secondary | ICD-10-CM | POA: Diagnosis not present

## 2023-04-18 DIAGNOSIS — K7460 Unspecified cirrhosis of liver: Secondary | ICD-10-CM | POA: Diagnosis not present

## 2023-04-18 HISTORY — PX: IR TRANSCATHETER BX: IMG713

## 2023-04-18 HISTORY — PX: IR EMBO VENOUS NOT HEMORR HEMANG  INC GUIDE ROADMAPPING: IMG5447

## 2023-04-18 HISTORY — PX: IR US GUIDE VASC ACCESS RIGHT: IMG2390

## 2023-04-18 HISTORY — PX: IR TRANSHEPATIC PORTOGRAM W HEMO: IMG690

## 2023-04-18 LAB — GLUCOSE, CAPILLARY
Glucose-Capillary: 91 mg/dL (ref 70–99)
Glucose-Capillary: 55 mg/dL — ABNORMAL LOW (ref 70–99)
Glucose-Capillary: 108 mg/dL — ABNORMAL HIGH (ref 70–99)
Glucose-Capillary: 57 mg/dL — ABNORMAL LOW (ref 70–99)

## 2023-04-18 LAB — CBC
HCT: 49.5 % — ABNORMAL HIGH (ref 36.0–46.0)
Hemoglobin: 17 g/dL — ABNORMAL HIGH (ref 12.0–15.0)
MCH: 31 pg (ref 26.0–34.0)
MCHC: 34.3 g/dL (ref 30.0–36.0)
MCV: 90.2 fL (ref 80.0–100.0)
Platelets: 294 10*3/uL (ref 150–400)
RBC: 5.49 MIL/uL — ABNORMAL HIGH (ref 3.87–5.11)
RDW: 11.3 % — ABNORMAL LOW (ref 11.5–15.5)
WBC: 8.8 10*3/uL (ref 4.0–10.5)
nRBC: 0 % (ref 0.0–0.2)

## 2023-04-18 LAB — COMPREHENSIVE METABOLIC PANEL
ALT: 33 U/L (ref 0–44)
AST: 31 U/L (ref 15–41)
Albumin: 4.3 g/dL (ref 3.5–5.0)
Alkaline Phosphatase: 42 U/L (ref 38–126)
Anion gap: 9 (ref 5–15)
BUN: 11 mg/dL (ref 6–20)
CO2: 24 mmol/L (ref 22–32)
Calcium: 9.5 mg/dL (ref 8.9–10.3)
Chloride: 108 mmol/L (ref 98–111)
Creatinine, Ser: 0.91 mg/dL (ref 0.44–1.00)
GFR, Estimated: >60 mL/min (ref >60)
Glucose, Bld: 87 mg/dL (ref 70–99)
Potassium: 4 mmol/L (ref 3.5–5.1)
Sodium: 141 mmol/L (ref 135–145)
Total Bilirubin: 1.1 mg/dL (ref 0.0–1.2)
Total Protein: 7.6 g/dL (ref 6.5–8.1)

## 2023-04-18 LAB — PREGNANCY, URINE: Preg Test, Ur: NEGATIVE

## 2023-04-18 LAB — PROTIME-INR
INR: 1.1 (ref 0.8–1.2)
Prothrombin Time: 14.4 s (ref 11.4–15.2)

## 2023-04-18 MED ORDER — FENTANYL CITRATE (PF) 100 MCG/2ML IJ SOLN
INTRAMUSCULAR | Status: AC
  Filled 2023-04-18: qty 2

## 2023-04-18 MED ORDER — IOHEXOL 300 MG/ML  SOLN
150.0000 mL | Freq: Once | INTRAMUSCULAR | Status: AC | PRN
  Administered 2023-04-18: 50 mL via INTRAVENOUS

## 2023-04-18 MED ORDER — TRAMADOL HCL 50 MG PO TABS: 50.0000 mg | ORAL_TABLET | Freq: Four times a day (QID) | ORAL | 0 refills | Status: AC | PRN

## 2023-04-18 MED ORDER — FENTANYL CITRATE (PF) 100 MCG/2ML IJ SOLN
INTRAMUSCULAR | Status: AC | PRN
Start: 2023-04-18 — End: 2023-04-18
  Administered 2023-04-18 (×4): 25 ug via INTRAVENOUS
  Administered 2023-04-18: 50 ug via INTRAVENOUS
  Administered 2023-04-18 (×4): 25 ug via INTRAVENOUS

## 2023-04-18 MED ORDER — LIDOCAINE-EPINEPHRINE 1 %-1:100000 IJ SOLN
INTRAMUSCULAR | Status: AC
  Filled 2023-04-18: qty 1

## 2023-04-18 MED ORDER — FENTANYL CITRATE (PF) 100 MCG/2ML IJ SOLN
INTRAMUSCULAR | Status: AC
  Filled 2023-04-18: qty 4

## 2023-04-18 MED ORDER — DIPHENHYDRAMINE HCL 50 MG/ML IJ SOLN
INTRAMUSCULAR | Status: AC | PRN
  Administered 2023-04-18: 50 mg via INTRAVENOUS

## 2023-04-18 MED ORDER — LIDOCAINE-EPINEPHRINE 1 %-1:100000 IJ SOLN
20.0000 mL | Freq: Once | INTRAMUSCULAR | Status: AC
Start: 2023-04-18 — End: 2023-04-18
  Administered 2023-04-18: 5 mL via INTRADERMAL

## 2023-04-18 MED ORDER — LIDOCAINE-EPINEPHRINE 2 %-1:100000 IJ SOLN: 20.0000 mL | Freq: Once | INTRAMUSCULAR | Status: DC

## 2023-04-18 MED ORDER — MIDAZOLAM HCL 2 MG/2ML IJ SOLN
INTRAMUSCULAR | Status: AC
  Filled 2023-04-18: qty 2

## 2023-04-18 MED ORDER — DIPHENHYDRAMINE HCL 50 MG/ML IJ SOLN
INTRAMUSCULAR | Status: AC
Start: 2023-04-18 — End: ?
  Filled 2023-04-18: qty 1

## 2023-04-18 MED ORDER — MIDAZOLAM HCL 2 MG/2ML IJ SOLN
INTRAMUSCULAR | Status: AC
  Filled 2023-04-18: qty 4

## 2023-04-18 MED ORDER — MIDAZOLAM HCL 2 MG/2ML IJ SOLN
INTRAMUSCULAR | Status: AC | PRN
Start: 2023-04-18 — End: 2023-04-18
  Administered 2023-04-18: .5 mg via INTRAVENOUS
  Administered 2023-04-18: 1 mg via INTRAVENOUS
  Administered 2023-04-18 (×2): .5 mg via INTRAVENOUS
  Administered 2023-04-18: 1 mg via INTRAVENOUS
  Administered 2023-04-18 (×3): .5 mg via INTRAVENOUS

## 2023-04-18 NOTE — Procedures (Signed)
Interventional Radiology Procedure Note  Procedure:  1) Left renal venogram 2) Gastrorenal varix venogram 3) Transvenous liver biopsy with pressure measurements  Findings: Please refer to procedural dictation for full description. Small gastric varix with hepatopetal flow exiting posterior gastric vein into patent portal vein.  Spasm versus focal dissection of varix upon cannulation.  No transvenous obliteration performed.  Right lobe transfemoral approach liver biopsy, 18 ga x2.   Mean pressures (mmHg): IVC = 10 Right hepatic vein = 7 Wedged portal vein = 11 HVPVG = 4 Portosystemic gradient = 1  Complications: None immediate  Estimated Blood Loss: < 5 ml  Recommendations: 2 hour bedrest. Follow Pathology results. Follow up in IR clinic in 2-3 weeks to discuss results as well as next steps including possible hemorrhoid embolization.   Marliss Coots, MD Pager: [phone]

## 2023-04-18 NOTE — Discharge Instructions (Signed)
Femoral Site Care This sheet gives you information about how to care for yourself after your procedure. Your health care provider may also give you more specific instructions. If you have problems or questions, contact your health care provider. What can I expect after the procedure?  After the procedure, it is common to have: Bruising that usually fades within 1-2 weeks. Tenderness at the site. Follow these instructions at home: Wound care Follow instructions from your health care provider about how to take care of your insertion site. Make sure you: Wash your hands with soap and water before you change your bandage (dressing). If soap and water are not available, use hand sanitizer. Remove your dressing as told by your health care provider. In 24 hours Do not take baths, swim, or use a hot tub until your health care provider approves. You may shower 24-48 hours after the procedure or as told by your health care provider. Gently wash the site with plain soap and water. Pat the area dry with a clean towel. Do not rub the site. This may cause bleeding. Do not apply powder or lotion to the site. Keep the site clean and dry. Check your femoral site every day for signs of infection. Check for: Redness, swelling, or pain. Fluid or blood. Warmth. Pus or a bad smell. Activity For the first 2-3 days after your procedure, or as long as directed: Avoid climbing stairs as much as possible. Do not squat. Do not lift anything that is heavier than 10 lb (4.5 kg), or the limit that you are told, until your health care provider says that it is safe. For 5 days Rest as directed. Avoid sitting for a long time without moving. Get up to take short walks every 1-2 hours. Do not drive for 24 hours if you were given a medicine to help you relax (sedative). General instructions Take over-the-counter and prescription medicines only as told by your health care provider. Keep all follow-up visits as told by  your health care provider. This is important. Contact a health care provider if you have: A fever or chills. You have redness, swelling, or pain around your insertion site. Get help right away if: The catheter insertion area swells very fast. You pass out. You suddenly start to sweat or your skin gets clammy. The catheter insertion area is bleeding, and the bleeding does not stop when you hold steady pressure on the area. The area near or just beyond the catheter insertion site becomes pale, cool, tingly, or numb. These symptoms may represent a serious problem that is an emergency. Do not wait to see if the symptoms will go away. Get medical help right away. Call your local emergency services (911 in the [place] Do not drive yourself to the hospital. Summary After the procedure, it is common to have bruising that usually fades within 1-2 weeks. Check your femoral site every day for signs of infection. Do not lift anything that is heavier than 10 lb (4.5 kg), or the limit that you are told, until your health care provider says that it is safe. This information is not intended to replace advice given to you by your health care provider. Make sure you discuss any questions you have with your health care provider. Document Revised: 03/19/2017 Document Reviewed: 03/19/2017 Elsevier Patient Education  2020 [employer].

## 2023-04-18 NOTE — Progress Notes (Signed)
Patient walked to the bathroom without difficulties. Right groin level 0, clean, dry, and intact.

## 2023-04-18 NOTE — Sedation Documentation (Addendum)
IR procedure pressures  Venous cava mean: 10  Right Hepatic Vein mean: 7  Portal Vein mean: 11

## 2023-04-18 NOTE — H&P (Signed)
Chief Complaint: Patient was seen in consultation today for cirrhosis with varices and hemorrhoids.   Referring Physician(s): Charlie Pitter III   Supervising Physician: Marliss Coots  Patient Status: [facility] - Out-pt  History of Present Illness: Carmen Nielsen is a 48 y.o. 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 She does not have a history of ascites or hepatic encephalopathy.    She is followed by [facility] and is also followed locally by Dr. Myrtie Neither. Dr. Myrtie Neither referred the patient to the Surgery team July 2023 for evaluation of bleeding internal hemorrhoids with significant perirectal swelling and pressure. It was determined that her symptoms were consistent with her variceal disease and thus not amenable to hemorrhoid banding or hemorrhoidectomy. It was recommended that she be evaluated by Interventional Radiology for embolization and the patient requested time to investigate this treatment option.    The patient last followed up with Dr. Myrtie Neither October 2024 for her screening colonoscopy. She reported continued issues with episodic rectal swelling and bleeding. Her colonoscopy showed swollen internal hemorrhoids prolapsed and contiguous with swollen external hemorrhoids. The exam was otherwise normal. Dr. Myrtie Neither has referred the patient to Interventional Radiology for possible treatment/management options including TIPS/BRTO and variceal embolization.    She met with Dr. Elby Showers 02/26/23 and she explained that bleeding/ruptured hemorrhoids occur approximately 2 times per year, and started approximately 4 years ago. These episodes are extremely painful. She has other mild bleeding from likely external hemorrhoids periodically.  She has never experienced hematemesis, melena, or hematochezia unrelated to hemorrhoids. Dr.  Elby Showers explained that her hemorrhoids have a component related to her portal hypertension and her isolated gastric varices with gastrorenal shunt make her a perfect candidate for retrograde transvenous obliteration. She also has a patent IMA and proximal SRAs which would make an "emborrhoid" feasible. Given the severity of her hemorrhoids he suggested proceeding with an "emborrhoid" first. He discussed the procedure in detail including risks, benefits and periprocedural expectations.   Dr. Elby Showers also discussed retrograde transvenous obliteration of her gastric varices including risks and benefits and periprocedural expectations. He discussed that during this procedure he could also do a transvenous liver biopsy and portal/hepatic venous pressure measurements to further diagnose her cirrhosis and degree of portal hypertension. After spending some time thinking through her options she decided she wanted to proceed. The plan is to perform a BRTO with transvenous liver biopsy with hemorrhoid embolization at a later date.     Past Medical History:  Diagnosis Date   Anxiety    Cirrhosis (HCC)    Cryptogenic cirrhosis (HCC)    Depression    DM (diabetes mellitus) (HCC)    Elevated LFTs    GAD (generalized anxiety disorder)    GERD (gastroesophageal reflux disease)    Hepatic steatosis    Portal hypertensive gastropathy (HCC)    Psoriasis    Vitamin D deficiency     Past Surgical History:  Procedure Laterality Date   IR RADIOLOGIST EVAL & MGMT  02/26/2023   LIVER BIOPSY     x 2   UPPER GASTROINTESTINAL ENDOSCOPY     WISDOM TOOTH EXTRACTION  1999    Allergies: Shellfish allergy  Medications: Prior to Admission medications   Medication Sig Start Date End Date Taking? Authorizing Provider  atorvastatin (LIPITOR) 40 MG tablet Take 40 mg by mouth daily. 03/04/21  Yes [provider]  carvedilol (COREG) 3.125 MG  tablet Take 3.125 mg by mouth 2 (two) times daily with a meal. 11/17/22  11/17/23 Yes [provider]  Docusate Calcium (STOOL SOFTENER PO) Take 1 tablet by mouth 2 (two) times a week.   Yes [provider]  DULoxetine (CYMBALTA) 60 MG capsule Take 60 mg by mouth daily.   Yes [provider]  etonogestrel (NEXPLANON) 68 MG IMPL implant 1 each by Subdermal route once.   Yes [provider]  Multiple Vitamin (MULTIVITAMIN) tablet Take 1 tablet by mouth daily.   Yes [provider]  nadolol (CORGARD) 20 MG tablet TAKE 1 TABLET(20 MG) BY MOUTH DAILY 01/05/21  Yes Danis, Starr Lake III, MD  OZEMPIC, 2 MG/DOSE, 8 MG/3ML SOPN Inject 1 Dose into the skin once a week.   Yes [provider]  FIBER COMPLETE PO Take 1 Dose by mouth daily at 6 (six) AM. Patient not taking: Reported on 01/18/2023    [provider]  tacrolimus (PROTOPIC) 0.1 % ointment Apply 1 Application topically 2 (two) times daily. Patient not taking: Reported on 01/18/2023 05/16/22   [provider]     Family History  Problem Relation Age of Onset   Bell's palsy Father    Colon polyps Father    Esophageal cancer Maternal Grandfather 42   Diabetes Maternal Grandfather    Diabetes Paternal Grandmother    Colon cancer Neg Hx    Stomach cancer Neg Hx    Liver disease Neg Hx    Rectal cancer Neg Hx     Social History   Socioeconomic History   Marital status: Single    Spouse name: Not on file   Number of children: 0   Years of education: Not on file   Highest education level: Not on file  Occupational History   Occupation: Warehouse manager  Tobacco Use   Smoking status: Every Day    Current packs/day: 0.50    Types: Cigarettes   Smokeless tobacco: Never  Vaping Use   Vaping status: Former  Substance and Sexual Activity   Alcohol use: No   Drug use: No   Sexual activity: Not on file  Other Topics Concern   Not on file  Social History Narrative   Not on file   Social Drivers of Health   Financial Resource Strain: Not on file   Food Insecurity: Low Risk  (05/11/2022)   [facility]   Hunger Vital Sign    Worried About Running Out of Food in the Last Year: Never true    Ran Out of Food in the Last Year: Never true  Transportation Needs: Not on file (05/11/2022)  Physical Activity: Not on file  Stress: Not on file  Social Connections: Not on file    Review of Systems: A 12 point ROS discussed and pertinent positives are indicated in the HPI above.  All other systems are negative.  Review of Systems  All other systems reviewed and are negative.   Vital Signs: BP 115/82   Pulse 80   Temp (!) 97.4 F (36.3 C) (Oral)   Resp 16   Ht 5\' 8"  (1.727 m)   Wt 160 lb (72.6 kg)   SpO2 100%   BMI 24.33 kg/m   Physical Exam Constitutional:      General: She is not in acute distress.    Appearance: She is not ill-appearing.  HENT:     Mouth/Throat:     Mouth: Mucous membranes are moist.  Pharynx: Oropharynx is clear.  Cardiovascular:     Rate and Rhythm: Normal rate.  Pulmonary:     Effort: Pulmonary effort is normal.  Skin:    General: Skin is warm and dry.  Neurological:     Mental Status: She is alert and oriented to person, place, and time.  Psychiatric:        Mood and Affect: Mood normal.        Behavior: Behavior normal.        Thought Content: Thought content normal.        Judgment: Judgment normal.     Imaging: No results found.  Labs:  CBC: Recent Labs    04/18/23 1002  WBC 8.8  HGB 17.0*  HCT 49.5*  PLT 294    COAGS: Recent Labs    04/18/23 1002  INR 1.1    BMP: Recent Labs    04/18/23 1002  NA 141  K 4.0  CL 108  CO2 24  GLUCOSE 87  BUN 11  CALCIUM 9.5  CREATININE 0.91  GFRNONAA >60    LIVER FUNCTION TESTS: Recent Labs    04/18/23 1002  BILITOT 1.1  AST 31  ALT 33  ALKPHOS 42  PROT 7.6  ALBUMIN 4.3    TUMOR MARKERS: No results for input(s): "AFPTM", "CEA", "CA199", "CHROMGRNA" in the last 8760  hours.  Assessment and Plan:  Cryptogenic cirrhosis with portal hypertension, gastric varices, and hemorrhagic internal hemorrhoids: Carmen Nielsen, 48 year old female, presents today for an image-guided balloon occluded retrograde transvenous obliteration with transvenous liver biopsy.   Risks and benefits of TIPS, BRTO and/or additional variceal embolization were discussed with the patient and/or the patient's family including, but not limited to, infection, bleeding, damage to adjacent structures, worsening hepatic and/or cardiac function, worsening and/or the development of altered mental status/encephalopathy, non-target embolization and death.   Risks and benefits of transvenous liver biopsy were discussed with the patient and/or patient's family including, but not limited to bleeding, infection, damage to adjacent structures or low yield requiring additional tests.  This interventional procedure involves the use of X-rays and because of the nature of the planned procedure, it is possible that we will have prolonged use of X-ray fluoroscopy.  Potential radiation risks to you include (but are not limited to) the following: - A slightly elevated risk for cancer  several years later in life. This risk is typically less than 0.5% percent. This risk is low in comparison to the normal incidence of human cancer, which is 33% for women and 50% for men according to the [employer] Society. - Radiation induced injury can include skin redness, resembling a rash, tissue breakdown / ulcers and hair loss (which can be temporary or permanent).   The likelihood of either of these occurring depends on the difficulty of the procedure and whether you are sensitive to radiation due to previous procedures, disease, or genetic conditions.   IF your procedure requires a prolonged use of radiation, you will be notified and given written instructions for further action.  It is your responsibility to  monitor the irradiated area for the 2 weeks following the procedure and to notify your physician if you are concerned that you have suffered a radiation induced injury.    All of the patient's questions were answered, patient is agreeable to proceed. She has been NPO. She is a full code. She does not take any blood-thinning medications.   Consent signed and in chart.  Thank you for this  interesting consult.  I greatly enjoyed meeting Joyanne Eddinger and look forward to participating in their care.  A copy of this report was sent to the requesting provider on this date.  Electronically Signed: Alwyn Ren, AGACNP-BC 04/18/2023, 12:50 PM   I spent a total of  30 Minutes   in face to face in clinical consultation, greater than 50% of which was counseling/coordinating care for Cirrhosis with portal hypertension.

## 2023-04-19 ENCOUNTER — Encounter: Payer: Self-pay | Admitting: Gastroenterology

## 2023-04-19 LAB — SURGICAL PATHOLOGY

## 2023-04-25 ENCOUNTER — Other Ambulatory Visit: Payer: Self-pay | Admitting: Interventional Radiology

## 2023-04-25 DIAGNOSIS — I864 Gastric varices: Secondary | ICD-10-CM

## 2023-05-06 NOTE — Progress Notes (Signed)
Referring Physician(s): Dr. Amada Jupiter III  Chief Complaint: The patient is seen in virtual telephone follow up today s/p renal and hepatic venogram with transvenous liver biopsy 04/18/23  History of present illness: HPI from initial consultation 02/26/23 Carmen Nielsen is a 48 y.o. 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 She does not have a history of ascites or hepatic encephalopathy.   She is followed by [facility] and is also followed locally by Dr. Myrtie Neither. Dr. Myrtie Neither referred the patient to the Surgery team July 2023 for evaluation of bleeding internal hemorrhoids with significant perirectal swelling and pressure. It was determined that her symptoms were consistent with her variceal disease and thus not amenable to hemorrhoid banding or hemorrhoidectomy. It was recommended that she be evaluated by Interventional Radiology for embolization and the patient requested time to investigate this treatment option.   The patient last followed up with Dr. Myrtie Neither October 2024 for her screening colonoscopy. She reported continued issues with episodic rectal swelling and bleeding. Her colonoscopy showed swollen internal hemorrhoids prolapsed and contiguous with swollen external hemorrhoids. The exam was otherwise normal. Dr. Myrtie Neither has referred the patient to Interventional Radiology for possible treatment/management options including TIPS/BRTO and variceal embolization.   Bleeding/ruptured hemorrhoids occur approximately 2 times per year, and started approximately 4 years ago.  These episodes are extremely painful.  She has other mild bleeding from likely external hemorrhoids periodically.  he has never experienced hematemesis, melena, or hematochezia unrelated to hemorrhoids.  She is not on anticoagulation.  We discussed each of  these pathologies in detail including the natural history and treatment options. She requested time to think about her choices and ultimately elected to proceed with retrograde transvenous obliteration of her gastric varices. We discussed also performing a transvenous liver biopsy and portal/hepatic pressure measurements to further diagnose her cirrhosis and degree of portal hypertension. She was in agreement to proceed.   She presented to the [facility] IR department 04/18/23 for a BRTO with transvenous liver biopsy. During the procedure there was no portosystemic gradient to suggest portal venous hypertension. A retrograde venogram of the gastrorenal shunt demonstrated gastric varices with hepatopetal flow, again suggestive of the absence of portal hypertension. Due to these findings no embolization/venous obliteration was performed. A transvenous liver biopsy was performed with pathology showing "focal mild non-specific portal inflammation with bridging fibrosis and early cirrhosis."   She presents today via virtual tele-health visit for follow up and further discussion. She has felt well since her procedure.  We reviewed the outcome of the procedure as well as her biopsy results.  She's interested in pursuing "emborrhoid" however wants to arrange it around timing with work responsibilities.    Past Medical History:  Diagnosis Date   Anxiety    Cirrhosis (HCC)    Cryptogenic cirrhosis (HCC)    Depression    DM (diabetes mellitus) (HCC)    Elevated LFTs    GAD (generalized anxiety disorder)    GERD (gastroesophageal reflux disease)    Hepatic steatosis    Portal hypertensive gastropathy (HCC)    Psoriasis    Vitamin D deficiency     Past Surgical History:  Procedure Laterality Date   IR EMBO VENOUS NOT HEMORR HEMANG  INC GUIDE ROADMAPPING  04/18/2023   IR RADIOLOGIST EVAL & MGMT  02/26/2023   IR TRANSCATHETER BX  04/18/2023   IR TRANSHEPATIC PORTOGRAM W HEMO  04/18/2023   IR US GUIDE VASC  ACCESS RIGHT  04/18/2023   LIVER BIOPSY     x 2   UPPER GASTROINTESTINAL ENDOSCOPY     WISDOM TOOTH EXTRACTION  1999    Allergies: Shellfish allergy  Medications: Prior to Admission medications   Medication Sig Start Date End Date Taking? Authorizing Provider  atorvastatin (LIPITOR) 40 MG tablet Take 40 mg by mouth daily. 03/04/21   [provider]  carvedilol (COREG) 3.125 MG tablet Take 3.125 mg by mouth 2 (two) times daily with a meal. 11/17/22 11/17/23  [provider]  Docusate Calcium (STOOL SOFTENER PO) Take 1 tablet by mouth 2 (two) times a week.    [provider]  DULoxetine (CYMBALTA) 60 MG capsule Take 60 mg by mouth daily.    [provider]  etonogestrel (NEXPLANON) 68 MG IMPL implant 1 each by Subdermal route once.    [provider]  FIBER COMPLETE PO Take 1 Dose by mouth daily at 6 (six) AM. Patient not taking: Reported on 01/18/2023    [provider]  Multiple Vitamin (MULTIVITAMIN) tablet Take 1 tablet by mouth daily.    [provider]  nadolol (CORGARD) 20 MG tablet TAKE 1 TABLET(20 MG) BY MOUTH DAILY 01/05/21   Danis, Starr Lake III, MD  OZEMPIC, 2 MG/DOSE, 8 MG/3ML SOPN Inject 1 Dose into the skin once a week.    [provider]  tacrolimus (PROTOPIC) 0.1 % ointment Apply 1 Application topically 2 (two) times daily. Patient not taking: Reported on 01/18/2023 05/16/22   [provider]     Family History  Problem Relation Age of Onset   Bell's palsy Father    Colon polyps Father    Esophageal cancer Maternal Grandfather 82   Diabetes Maternal Grandfather    Diabetes Paternal Grandmother    Colon cancer Neg Hx    Stomach cancer Neg Hx    Liver disease Neg Hx    Rectal cancer Neg Hx     Social History   Socioeconomic History   Marital status: Single    Spouse name: Not on file   Number of children: 0   Years of education: Not on file   Highest education level: Not on file   Occupational History   Occupation: Clerical  Tobacco Use   Smoking status: Every Day    Current packs/day: 0.50    Types: Cigarettes   Smokeless tobacco: Never  Vaping Use   Vaping status: Former  Substance and Sexual Activity   Alcohol use: No   Drug use: No   Sexual activity: Not on file  Other Topics Concern   Not on file  Social History Narrative   Not on file   Social Drivers of Health   Financial Resource Strain: Not on file  Food Insecurity: Low Risk  (05/11/2022)   [facility]   Hunger Vital Sign    Worried About Running Out of Food in the Last Year: Never true    Ran Out of Food in the Last Year: Never true  Transportation Needs: Not on file (05/11/2022)  Physical Activity: Not on file  Stress: Not on file  Social Connections: Not on file     Vital Signs: There were no vitals taken for this visit.  Physical Exam  Imaging: CTA AP 02/22/23  Gastric varices with gastrorenal shunt.  Minimal morphologic changes of cirrhosis.  Patent portal system. No ascites.    Patent IMA -->  SRAs  Labs:  CBC: Recent Labs    04/18/23 1002  WBC 8.8  HGB 17.0*  HCT 49.5*  PLT 294    COAGS: Recent Labs    04/18/23 1002  INR 1.1    BMP: Recent Labs    04/18/23 1002  NA 141  K 4.0  CL 108  CO2 24  GLUCOSE 87  BUN 11  CALCIUM 9.5  CREATININE 0.91  GFRNONAA >60    LIVER FUNCTION TESTS: Recent Labs    04/18/23 1002  BILITOT 1.1  AST 31  ALT 33  ALKPHOS 42  PROT 7.6  ALBUMIN 4.3    Assessment and Plan:  48 year old female with a history of cryptogenic early cirrhosis (Child Pugh A5, MELD 6) with gastric varices, and hemorrhagic internal hemorrhoids. She does not have associated portal hypertension.  Her varices and portal vein demonstrated antegrade/hepatopetal flow.  Gastric variceal obliteration was attempted but aborted due to vessel spasm.  Given her absence of portal hypertension and overall good health, I  have very low concern of catastrophic spontaneous hemorrhage.  She would like to have her hemorrhoids addressed via superior rectal artery embolization/"emborrhoid" which she'd like to call us back when she's ready to schedule.   Marliss Coots, MD Pager: [phone]    I spent a total of 40 Minutes in virtual telephone clinical consultation, greater than 50% of which was counseling/coordinating care for cirrhosis with portal hypertension.

## 2023-05-07 ENCOUNTER — Ambulatory Visit
Admission: RE | Admit: 2023-05-07 | Discharge: 2023-05-07 | Disposition: A | Discharge status: Home / Self Care | Payer: BC Managed Care – PPO | Source: Ambulatory Visit | Attending: Interventional Radiology

## 2023-05-07 DIAGNOSIS — I864 Gastric varices: Secondary | ICD-10-CM

## 2023-05-07 DIAGNOSIS — K7460 Unspecified cirrhosis of liver: Secondary | ICD-10-CM | POA: Diagnosis not present

## 2023-05-07 DIAGNOSIS — K648 Other hemorrhoids: Secondary | ICD-10-CM | POA: Diagnosis not present

## 2023-05-07 HISTORY — PX: IR RADIOLOGIST EVAL & MGMT: IMG5224

## 2023-05-22 DIAGNOSIS — L409 Psoriasis, unspecified: Secondary | ICD-10-CM | POA: Diagnosis not present

## 2023-05-22 DIAGNOSIS — E1169 Type 2 diabetes mellitus with other specified complication: Secondary | ICD-10-CM | POA: Diagnosis not present

## 2023-05-22 DIAGNOSIS — K7469 Other cirrhosis of liver: Secondary | ICD-10-CM | POA: Diagnosis not present

## 2023-05-22 DIAGNOSIS — E782 Mixed hyperlipidemia: Secondary | ICD-10-CM | POA: Diagnosis not present

## 2023-05-25 ENCOUNTER — Other Ambulatory Visit: Payer: Self-pay | Admitting: Nurse Practitioner

## 2023-05-25 DIAGNOSIS — K766 Portal hypertension: Secondary | ICD-10-CM | POA: Diagnosis not present

## 2023-05-25 DIAGNOSIS — K7469 Other cirrhosis of liver: Secondary | ICD-10-CM

## 2023-05-25 DIAGNOSIS — I8510 Secondary esophageal varices without bleeding: Secondary | ICD-10-CM | POA: Diagnosis not present

## 2023-06-01 ENCOUNTER — Encounter: Payer: Self-pay | Admitting: Nurse Practitioner

## 2023-06-05 ENCOUNTER — Ambulatory Visit
Admission: RE | Admit: 2023-06-05 | Discharge: 2023-06-05 | Disposition: A | Discharge status: Home / Self Care | Source: Ambulatory Visit | Attending: Nurse Practitioner | Admitting: Nurse Practitioner

## 2023-06-05 DIAGNOSIS — K7469 Other cirrhosis of liver: Secondary | ICD-10-CM

## 2023-06-05 DIAGNOSIS — K7460 Unspecified cirrhosis of liver: Secondary | ICD-10-CM | POA: Diagnosis not present

## 2023-07-06 IMAGING — MG MM DIGITAL SCREENING BILAT W/ TOMO AND CAD
8 series · 8 of 24 positions shown · non-contrast
Comparison: Previous exam(s).

CLINICAL DATA: Screening.

EXAM:
DIGITAL SCREENING BILATERAL MAMMOGRAM WITH TOMOSYNTHESIS AND CAD
TECHNIQUE: Bilateral screening digital craniocaudal and mediolateral oblique
mammograms were obtained. Bilateral screening digital breast
tomosynthesis was performed. The images were evaluated with
computer-aided detection.

[L CC synth-2D]
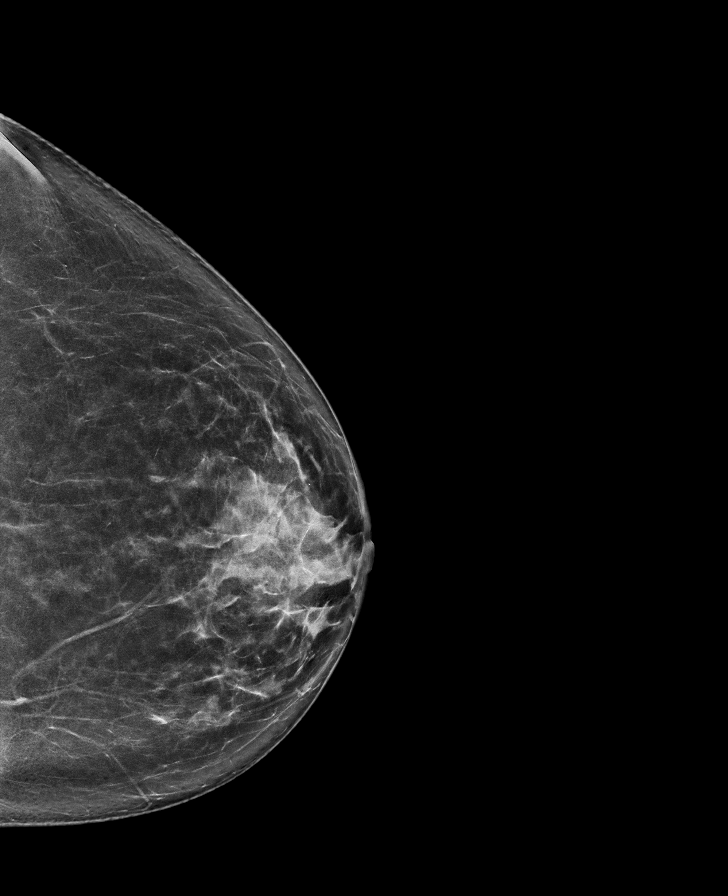

[R MLO synth-2D]
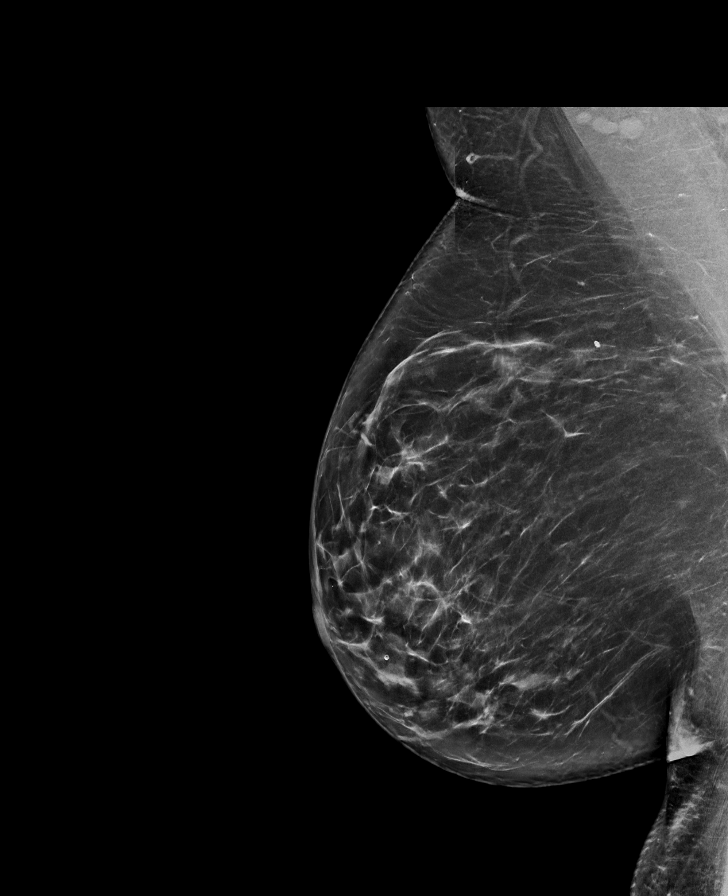

[L MLO synth-2D]
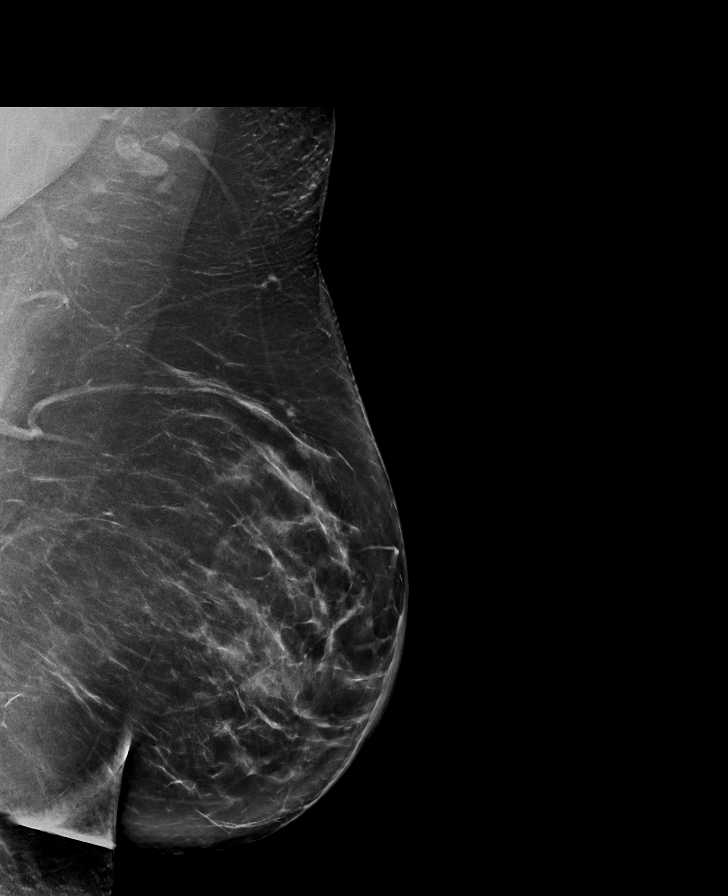

[R CC synth-2D]
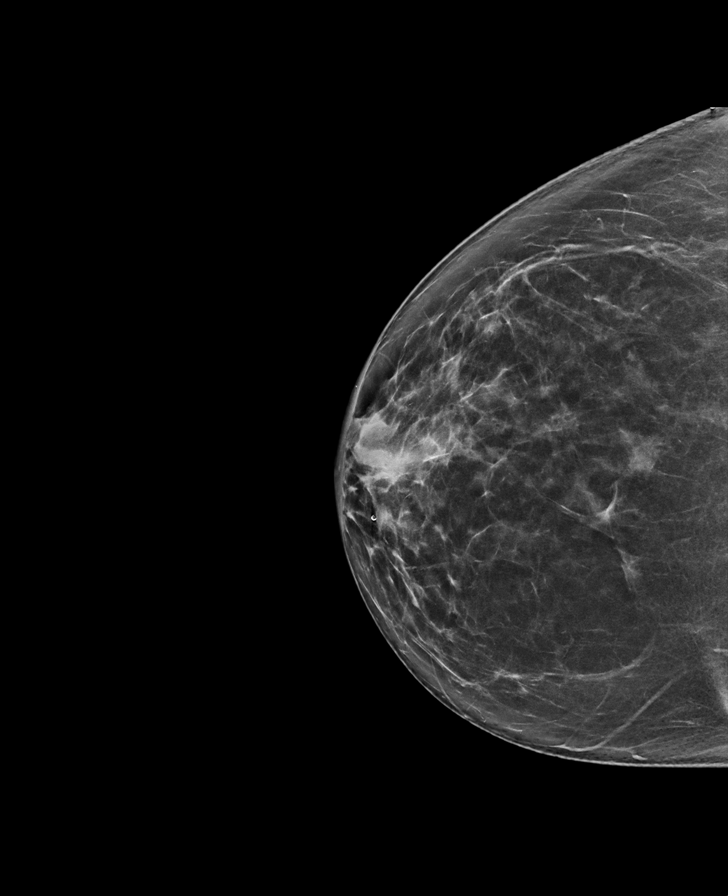

[L CC tomo · tomo slice 39/77.0]
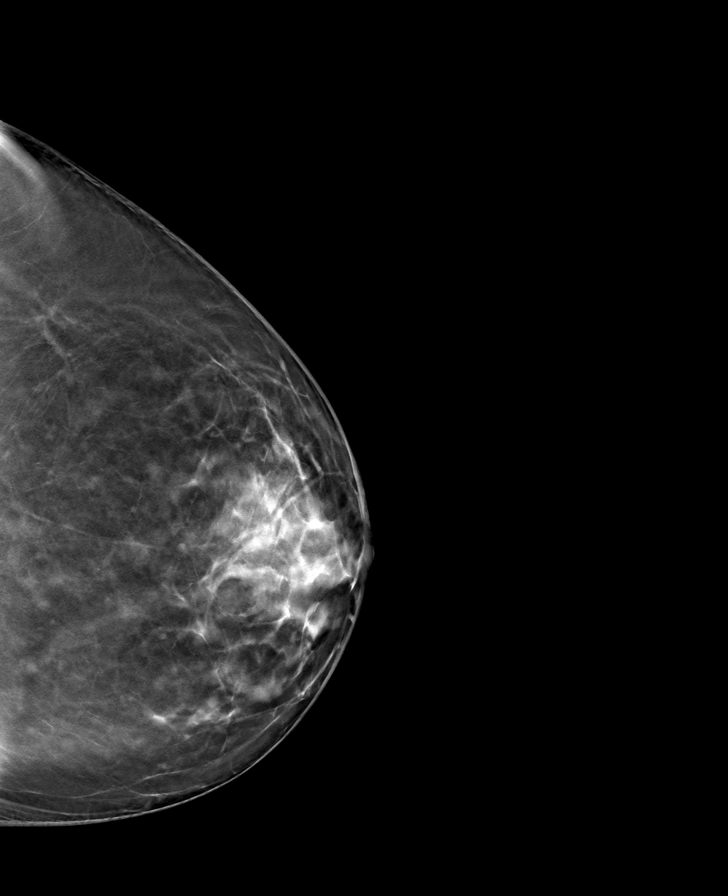

[R MLO tomo · tomo slice 45/89.0]
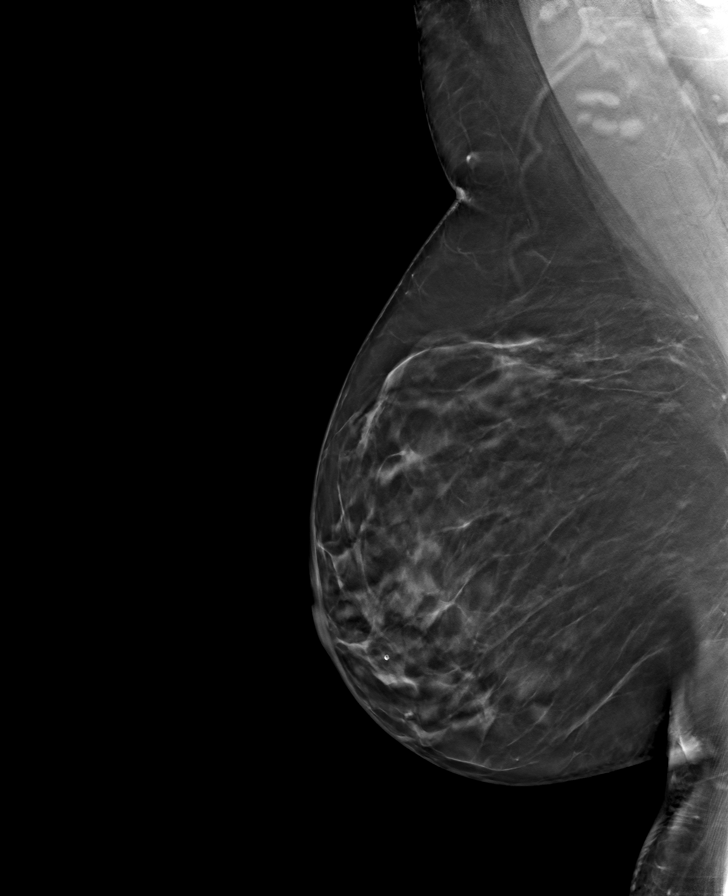

[R CC tomo · tomo slice 36/71.0]
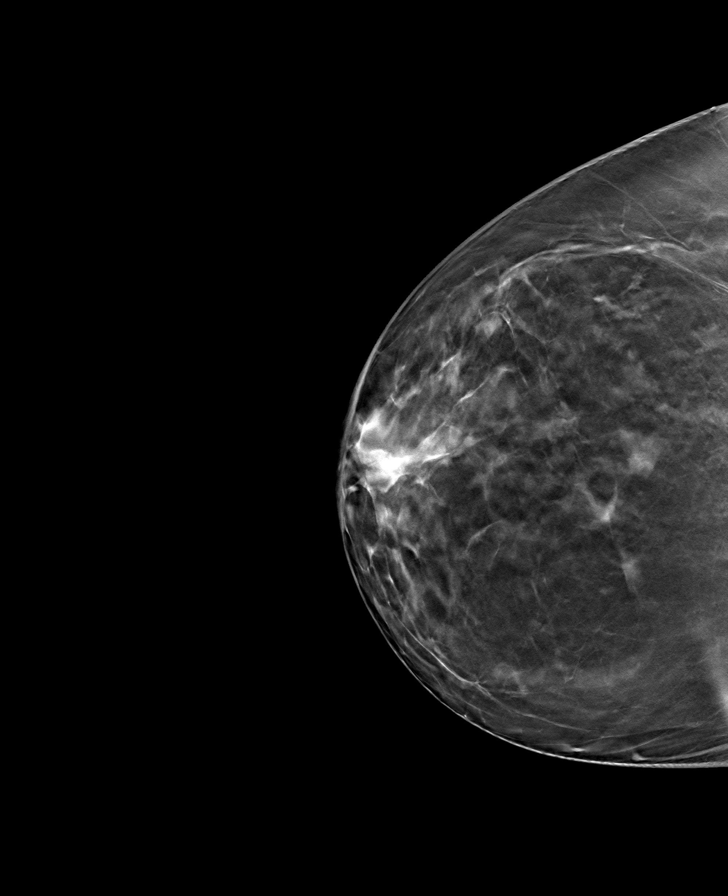

[L MLO tomo · tomo slice 49/98.0]
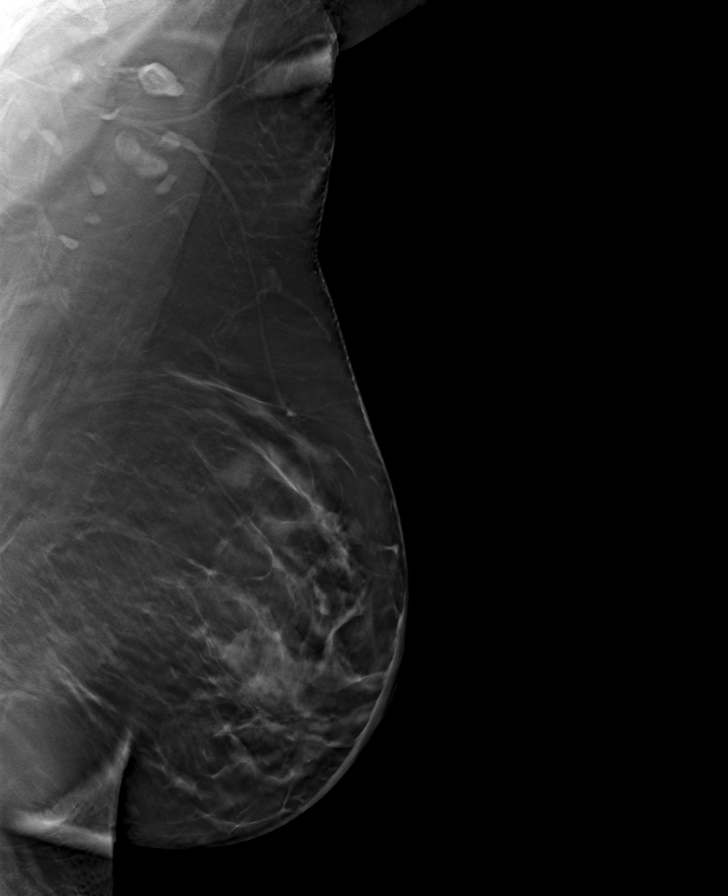

[8 of 24 positions shown; findings below may reference images not displayed]

ACR Breast Density Category b: There are scattered areas of
fibroglandular density.
FINDINGS: In the left breast, a possible mass warrants further evaluation. In
the right breast, no findings suspicious for malignancy.
IMPRESSION: Further evaluation is suggested for a possible mass in the left
breast.

RECOMMENDATION:
Diagnostic mammogram and possibly ultrasound of the left breast.
(Code:VD-X-44M)

The patient will be contacted regarding the findings, and additional
imaging will be scheduled.

BI-RADS CATEGORY  0: Incomplete. Need additional imaging evaluation
and/or prior mammograms for comparison.

## 2023-09-27 IMAGING — US US ABDOMEN LIMITED
1 series · 14 of 25 positions shown · non-contrast
Comparison: Right upper quadrant ultrasound dated 06/09/2020.

CLINICAL DATA: Cryptogenic cirrhosis.

EXAM:
ULTRASOUND ABDOMEN LIMITED RIGHT UPPER QUADRANT

[Series 1: us abdomen limited · 0.23mm/px · 14 of 45 slices shown]
[im 1/45]
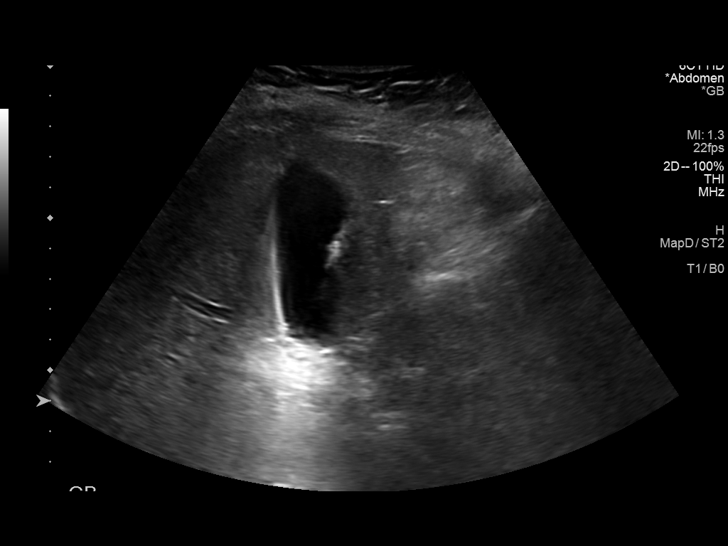
[im 4/45]
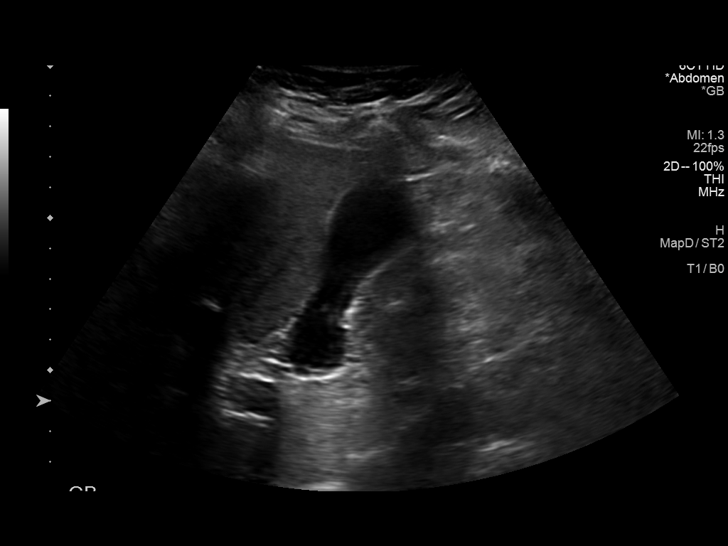
[im 8/45]
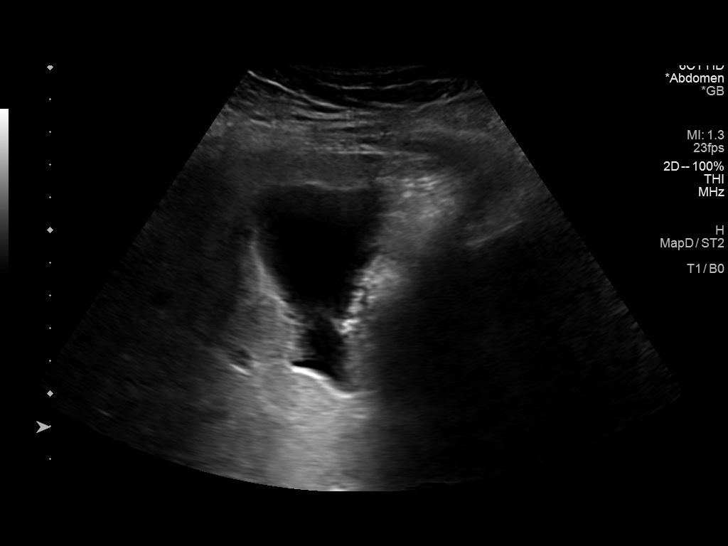
[im 12/45]
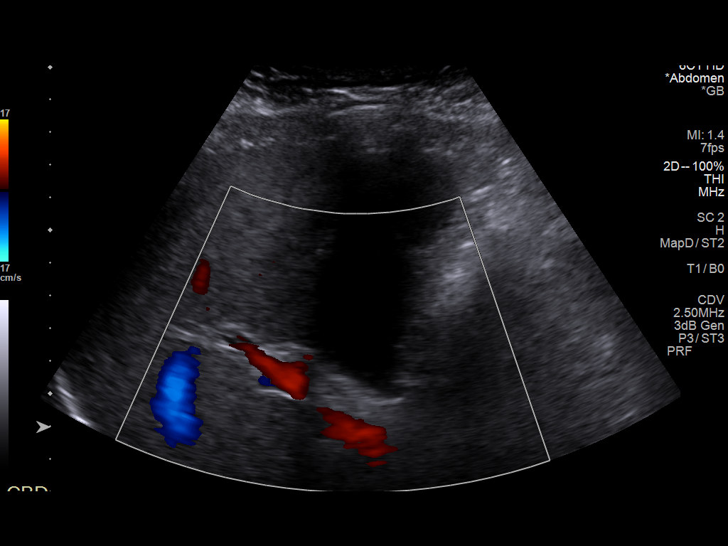
[im 15/45]
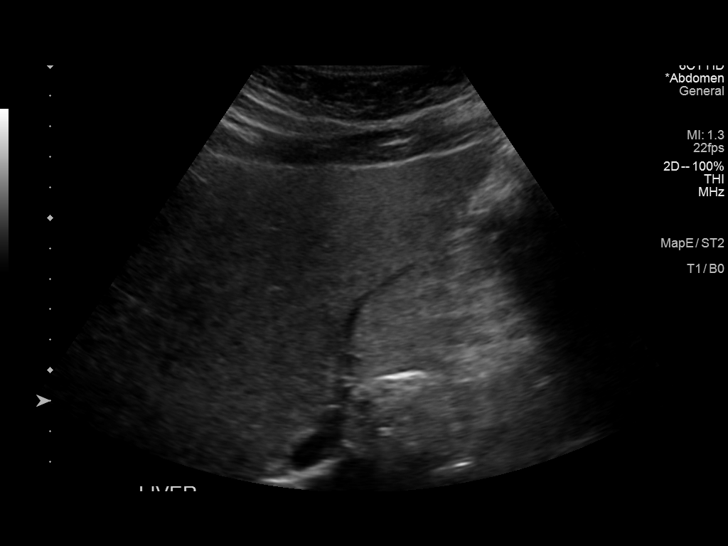
[im 17/45]
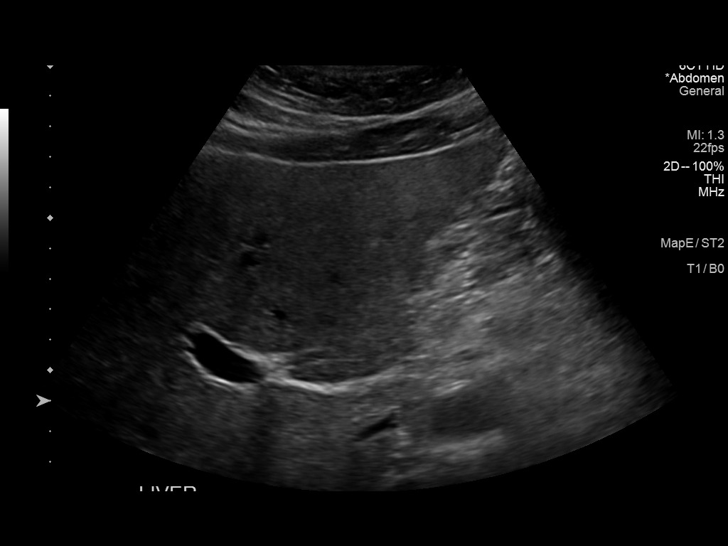
[im 21/45]
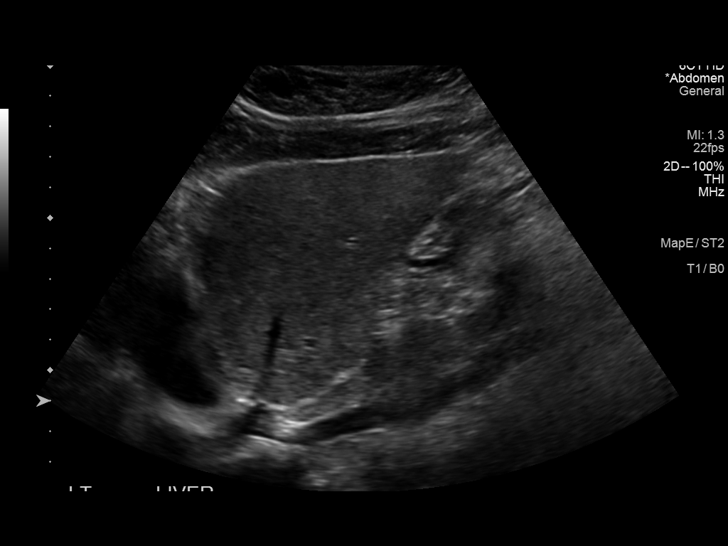
[im 24/45]
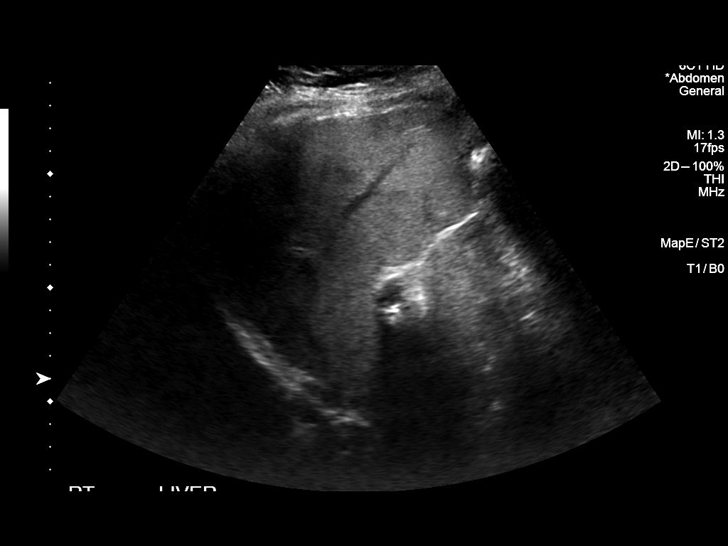
[im 28/45]
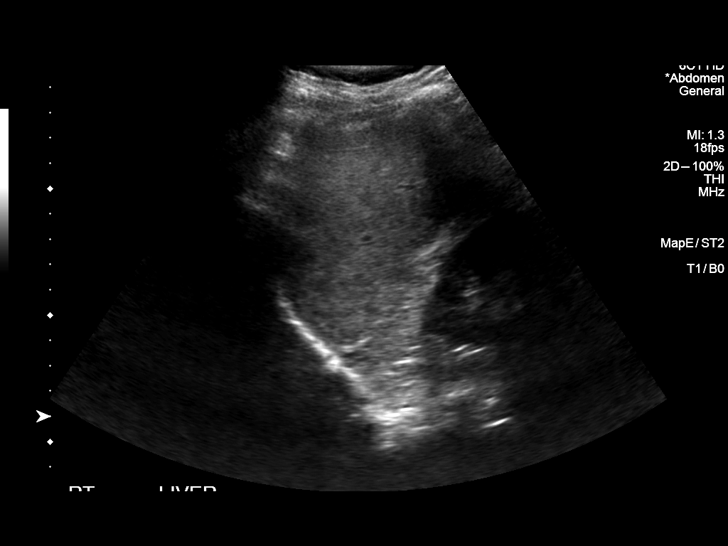
[im 30/45]
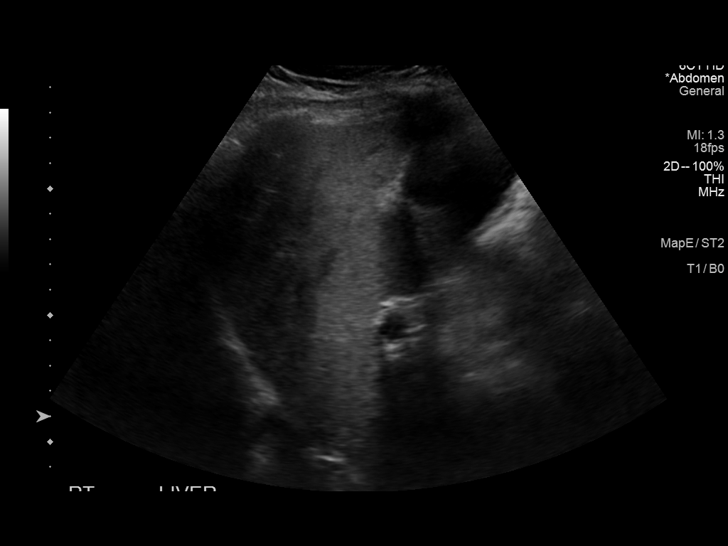
[im 34/45]
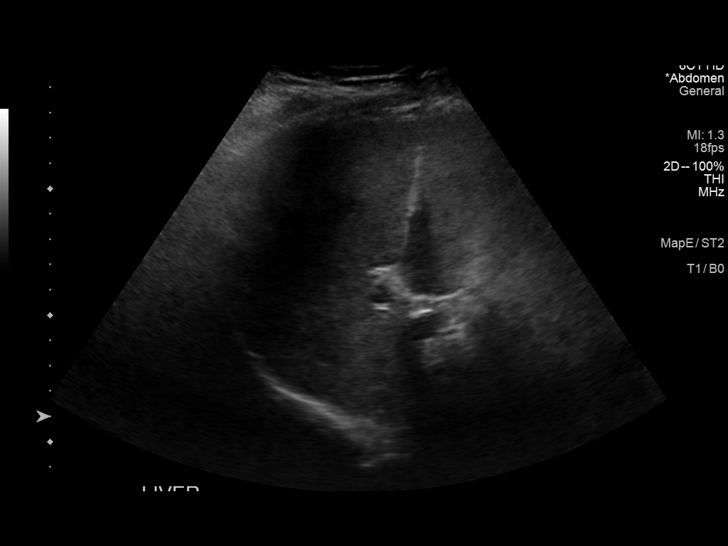
[im 37/45]
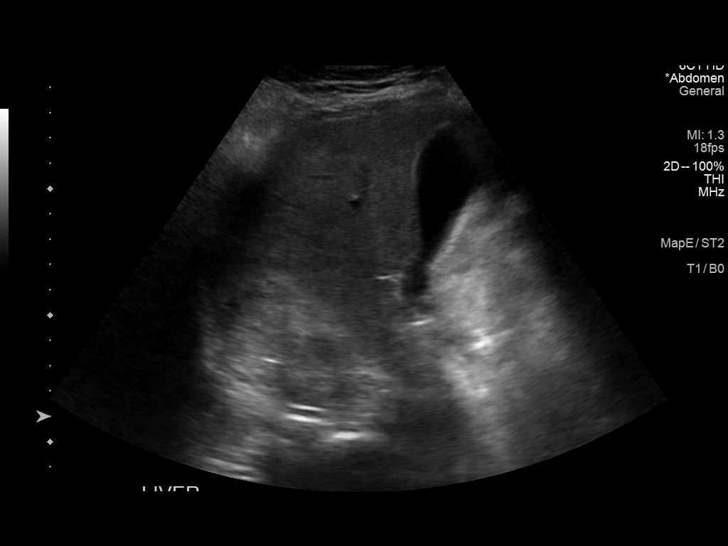
[im 41/45]
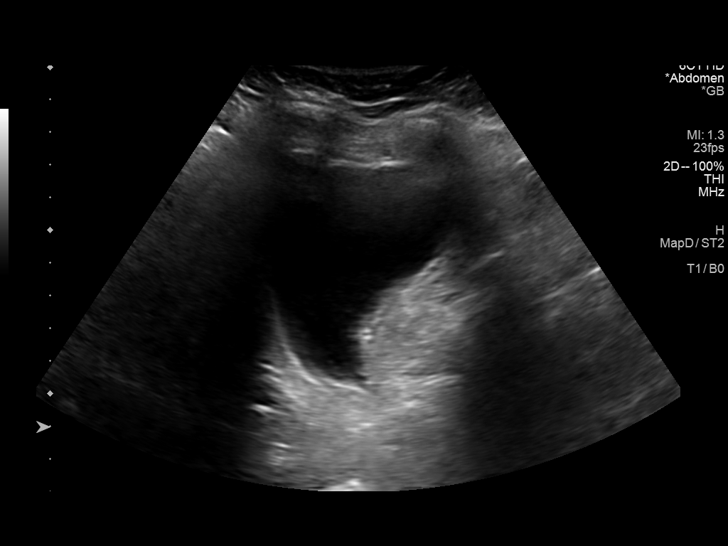
[im 45/45]
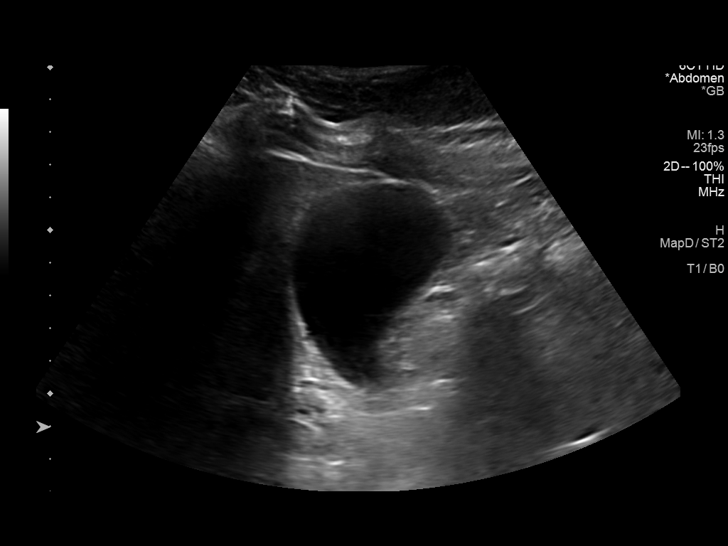

[14 of 25 positions shown; findings below may reference images not displayed]

FINDINGS: Gallbladder:

No gallstones or wall thickening visualized. No sonographic Murphy
sign noted by sonographer.

Common bile duct:

Diameter: 2 mm

Liver:

There is diffuse increased liver echogenicity most commonly seen in
the setting of fatty infiltration. Superimposed inflammation or
fibrosis is not excluded. Clinical correlation is recommended.
Portal vein is patent on color Doppler imaging with normal direction
of blood flow towards the liver.

Other: None.
IMPRESSION: Fatty liver, otherwise unremarkable right upper quadrant ultrasound.

## 2023-11-28 DIAGNOSIS — E781 Pure hyperglyceridemia: Secondary | ICD-10-CM | POA: Diagnosis not present

## 2023-11-28 DIAGNOSIS — F411 Generalized anxiety disorder: Secondary | ICD-10-CM | POA: Diagnosis not present

## 2023-11-28 DIAGNOSIS — E782 Mixed hyperlipidemia: Secondary | ICD-10-CM | POA: Diagnosis not present

## 2023-11-28 DIAGNOSIS — Z0000 Encounter for general adult medical examination without abnormal findings: Secondary | ICD-10-CM | POA: Diagnosis not present

## 2023-11-28 DIAGNOSIS — F331 Major depressive disorder, recurrent, moderate: Secondary | ICD-10-CM | POA: Diagnosis not present

## 2023-11-28 DIAGNOSIS — F17210 Nicotine dependence, cigarettes, uncomplicated: Secondary | ICD-10-CM | POA: Diagnosis not present

## 2023-11-28 DIAGNOSIS — E1169 Type 2 diabetes mellitus with other specified complication: Secondary | ICD-10-CM | POA: Diagnosis not present

## 2023-12-05 ENCOUNTER — Other Ambulatory Visit: Payer: Self-pay | Admitting: Nurse Practitioner

## 2023-12-05 DIAGNOSIS — I8510 Secondary esophageal varices without bleeding: Secondary | ICD-10-CM | POA: Diagnosis not present

## 2023-12-05 DIAGNOSIS — K7469 Other cirrhosis of liver: Secondary | ICD-10-CM

## 2023-12-05 DIAGNOSIS — K766 Portal hypertension: Secondary | ICD-10-CM | POA: Diagnosis not present

## 2023-12-11 ENCOUNTER — Ambulatory Visit
Admission: RE | Admit: 2023-12-11 | Discharge: 2023-12-11 | Disposition: A | Discharge status: Home / Self Care | Source: Ambulatory Visit | Attending: Nurse Practitioner | Admitting: Nurse Practitioner

## 2023-12-11 DIAGNOSIS — K7469 Other cirrhosis of liver: Secondary | ICD-10-CM

## 2024-03-27 IMAGING — US US ABDOMEN LIMITED
1 series · 14 of 25 positions shown · non-contrast
Comparison: Ultrasound abdomen 02/09/2021

CLINICAL DATA: History of cirrhosis.

EXAM:
ULTRASOUND ABDOMEN LIMITED RIGHT UPPER QUADRANT

[Series 1: us abdomen limited · 0.17mm/px · 14 of 45 slices shown]
[im 1/45]
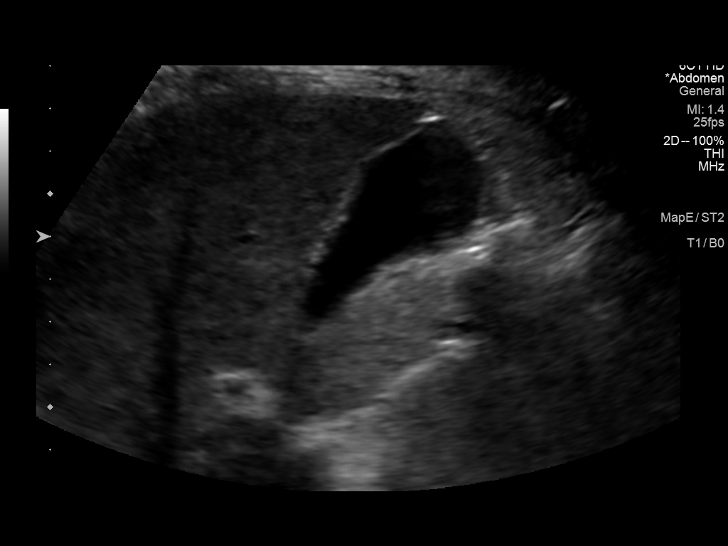
[im 4/45]
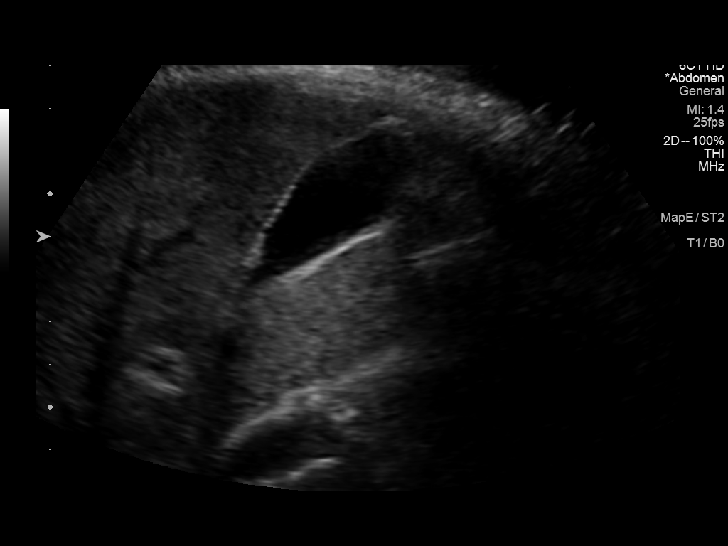
[im 8/45]
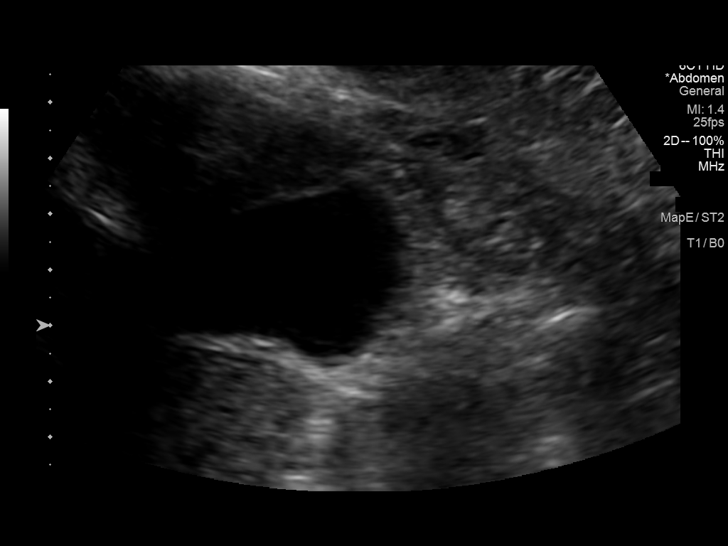
[im 12/45]
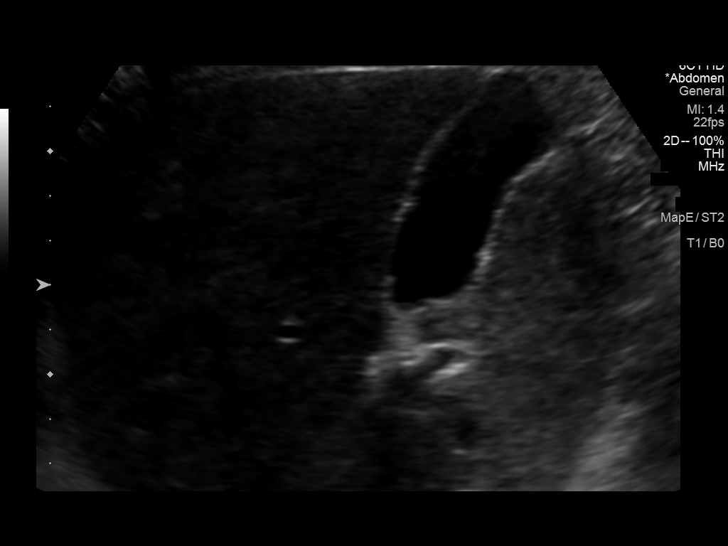
[im 15/45]
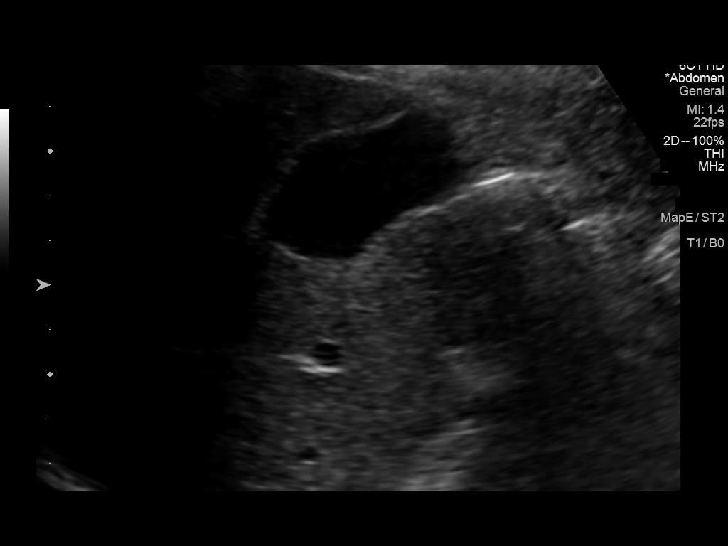
[im 17/45]
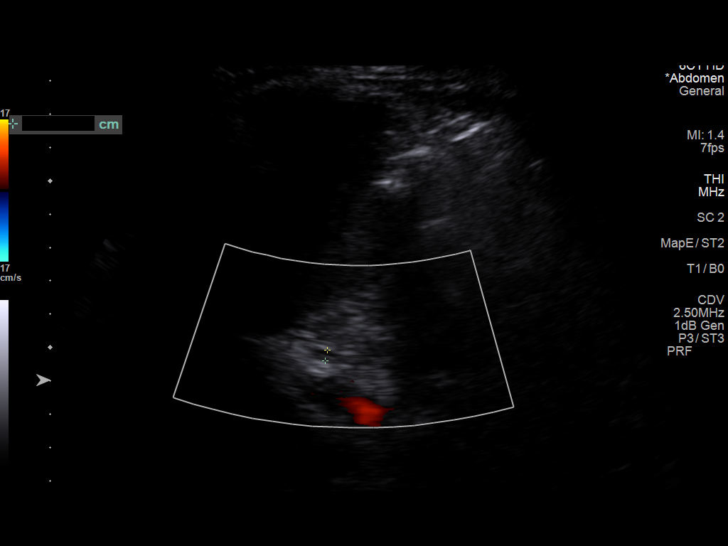
[im 21/45]
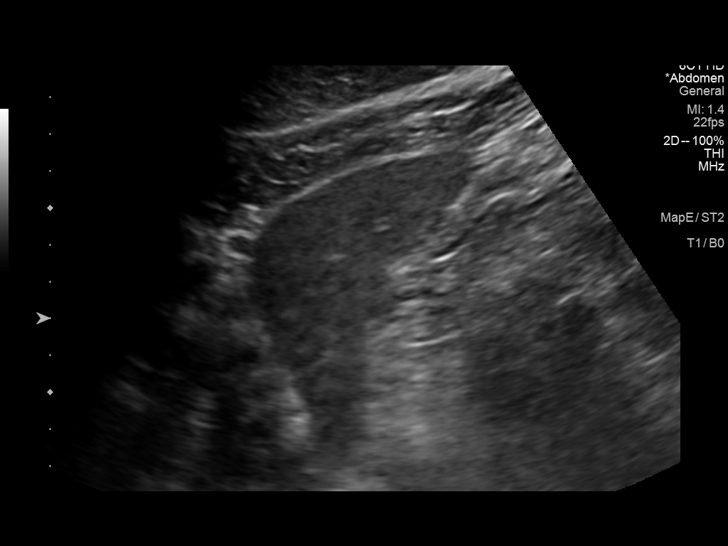
[im 24/45]
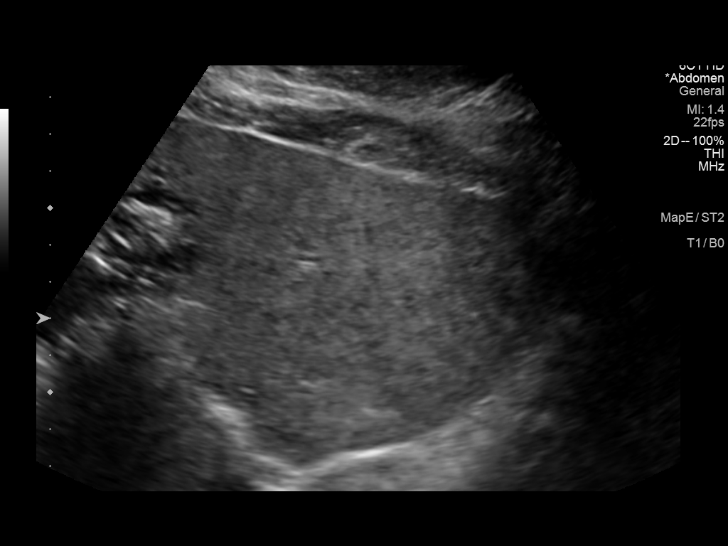
[im 28/45]
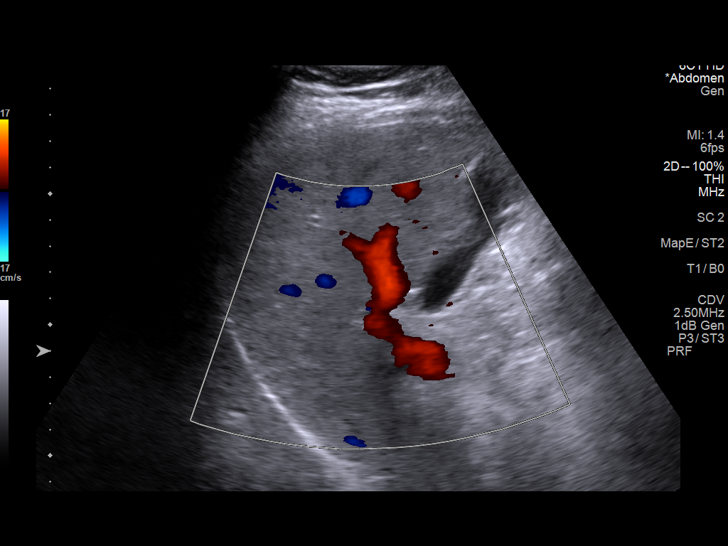
[im 30/45]
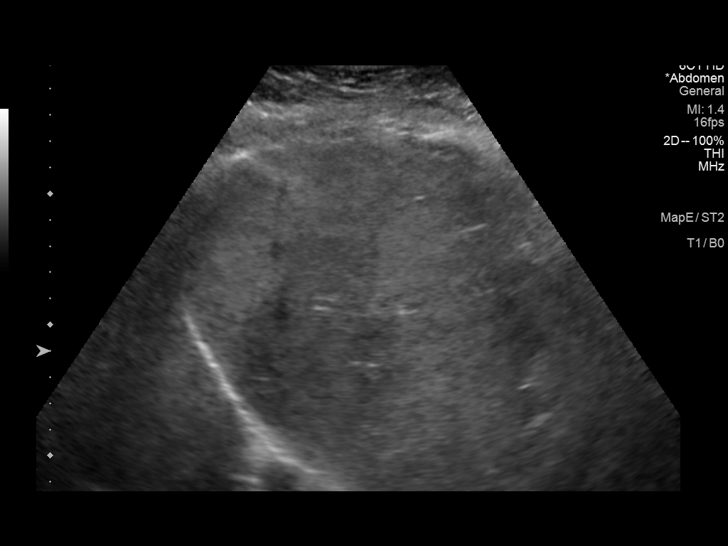
[im 34/45]
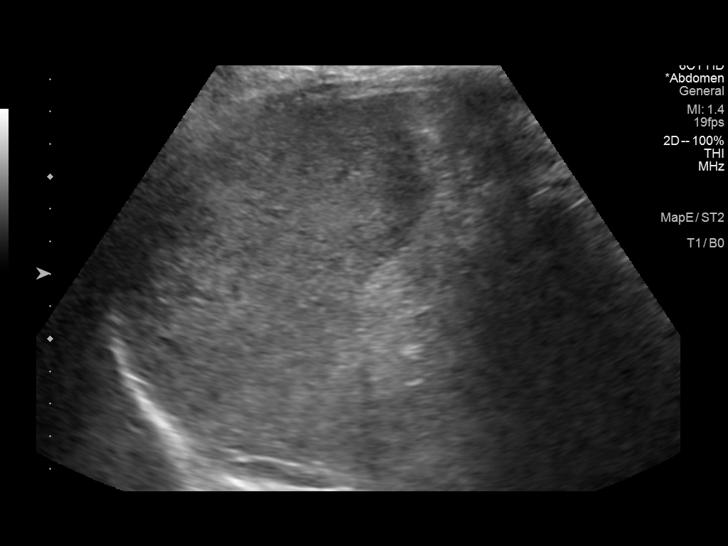
[im 37/45]
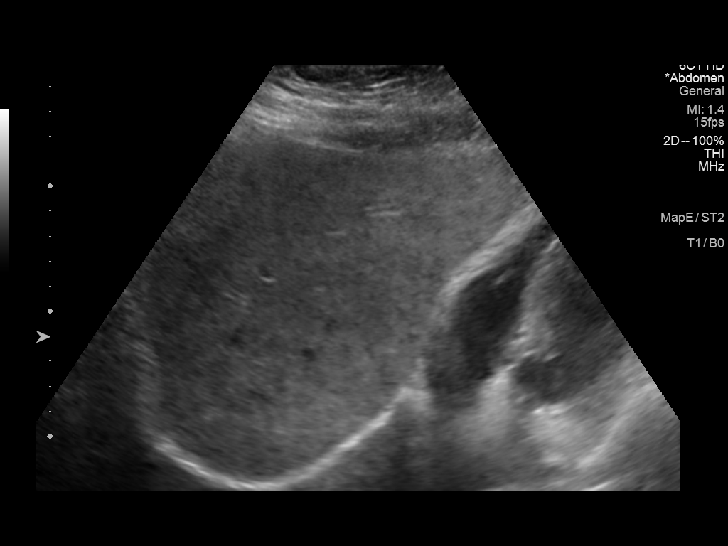
[im 41/45]
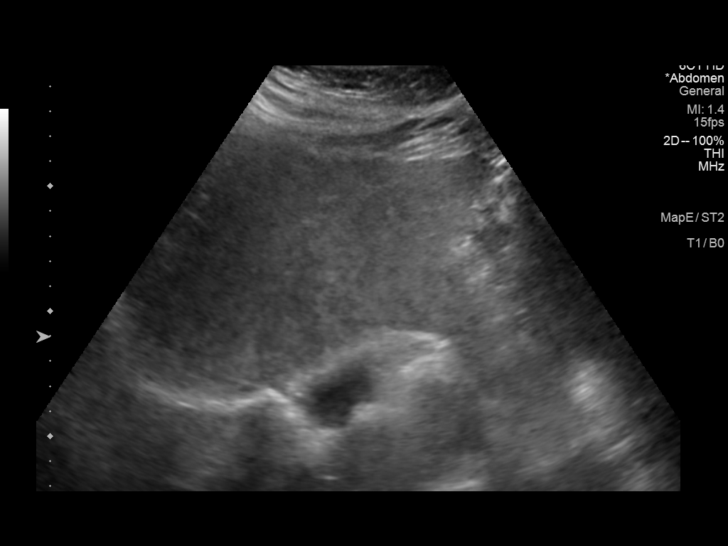
[im 45/45]
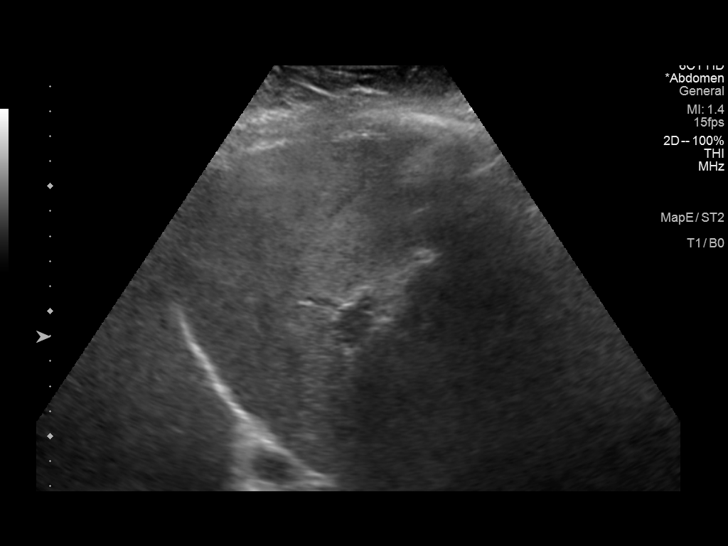

[14 of 25 positions shown; findings below may reference images not displayed]

FINDINGS: Gallbladder:

No gallstones or wall thickening visualized. No sonographic Murphy
sign noted by sonographer.

Common bile duct:

Diameter: 3 mm

Liver:

Diffusely increased hepatic parenchymal echogenicity. No focal
lesion. Portal vein is patent on color Doppler imaging with normal
direction of blood flow towards the liver.

Other: None.
IMPRESSION: Increased hepatic parenchymal echogenicity suggestive of steatosis.

No cholelithiasis or sonographic evidence for acute cholecystitis.
